# Patient Record
Sex: Female | Born: 1990 | Race: Black or African American | Hispanic: No | Marital: Single | Smoking: Never smoker
Health system: Southern US, Community
[De-identification: ages and names within clinical notes are randomized; demographics above are authoritative.]

## PROBLEM LIST (undated history)

## (undated) DIAGNOSIS — I10 Essential (primary) hypertension: Secondary | ICD-10-CM

## (undated) DIAGNOSIS — R51 Headache: Secondary | ICD-10-CM

## (undated) DIAGNOSIS — E041 Nontoxic single thyroid nodule: Secondary | ICD-10-CM

## (undated) DIAGNOSIS — D649 Anemia, unspecified: Secondary | ICD-10-CM

## (undated) DIAGNOSIS — R519 Headache, unspecified: Secondary | ICD-10-CM

## (undated) HISTORY — PX: WISDOM TOOTH EXTRACTION: SHX21

---

## 2016-10-21 ENCOUNTER — Emergency Department (HOSPITAL_COMMUNITY)
Admission: EM | Admit: 2016-10-21 | Discharge: 2016-10-21 | Disposition: A | Discharge status: Home / Self Care | Payer: BLUE CROSS/BLUE SHIELD | Attending: Emergency Medicine | Admitting: Emergency Medicine

## 2016-10-21 ENCOUNTER — Emergency Department (HOSPITAL_COMMUNITY): Payer: BLUE CROSS/BLUE SHIELD

## 2016-10-21 ENCOUNTER — Encounter (HOSPITAL_COMMUNITY): Payer: Self-pay | Admitting: Emergency Medicine

## 2016-10-21 DIAGNOSIS — R0789 Other chest pain: Secondary | ICD-10-CM | POA: Insufficient documentation

## 2016-10-21 DIAGNOSIS — R221 Localized swelling, mass and lump, neck: Secondary | ICD-10-CM | POA: Diagnosis not present

## 2016-10-21 DIAGNOSIS — R079 Chest pain, unspecified: Secondary | ICD-10-CM | POA: Diagnosis present

## 2016-10-21 DIAGNOSIS — I10 Essential (primary) hypertension: Secondary | ICD-10-CM | POA: Insufficient documentation

## 2016-10-21 HISTORY — DX: Essential (primary) hypertension: I10

## 2016-10-21 LAB — BASIC METABOLIC PANEL
ANION GAP: 5 (ref 5–15)
BUN: 14 mg/dL (ref 6–20)
CO2: 27 mmol/L (ref 22–32)
Calcium: 9.4 mg/dL (ref 8.9–10.3)
Chloride: 106 mmol/L (ref 101–111)
Creatinine, Ser: 0.84 mg/dL (ref 0.44–1.00)
GFR calc Af Amer: >60 mL/min (ref >60)
GFR calc non Af Amer: >60 mL/min (ref >60)
GLUCOSE: 141 mg/dL — AB (ref 65–99)
POTASSIUM: 3.5 mmol/L (ref 3.5–5.1)
Sodium: 138 mmol/L (ref 135–145)

## 2016-10-21 LAB — I-STAT TROPONIN, ED: Troponin i, poc: 0 ng/mL (ref 0.00–0.08)

## 2016-10-21 LAB — CBC
HEMATOCRIT: 38.2 % (ref 36.0–46.0)
Hemoglobin: 12.7 g/dL (ref 12.0–15.0)
MCH: 28 pg (ref 26.0–34.0)
MCHC: 33.2 g/dL (ref 30.0–36.0)
MCV: 84.1 fL (ref 78.0–100.0)
Platelets: 390 10*3/uL (ref 150–400)
RBC: 4.54 MIL/uL (ref 3.87–5.11)
RDW: 14.4 % (ref 11.5–15.5)
WBC: 5.1 10*3/uL (ref 4.0–10.5)

## 2016-10-21 LAB — TSH: TSH: 0.764 u[IU]/mL (ref 0.350–4.500)

## 2016-10-21 NOTE — Discharge Instructions (Signed)
Read the information below.  You may return to the Emergency Department at any time for worsening condition or any new symptoms that concern you.  If you develop worsening chest pain, shortness of breath, fever, you pass out, or become weak or dizzy, return to the ER for a recheck.     Please call your primary care provider to get worked in as soon as possible for further evaluation of the mass/knot on the front of your neck.  If you develop high fevers, difficulty swallowing or breathing, or you are unable to tolerate fluids by mouth, return to the ER immediately for a recheck.

## 2016-10-21 NOTE — ED Triage Notes (Signed)
Pt c/o intermittent sharp chest pain lasting a couple seconds onset a few days ago, lasting longer up to 1.5 minutes lately. No SOB, dizziness, palpitations.   Concerned about mass in throat x several weeks, unsure if related. Soft mass visualized at site of right thyroid lobe, moves with swallowing along with normal-sized left thyroid lobe. Hypertension since age 26.

## 2016-10-21 NOTE — ED Provider Notes (Signed)
[facility] DEPT Provider Note   CSN: [id number] Arrival date & time: 10/21/16  1148     History   Chief Complaint Chief Complaint  Patient presents with  . Chest Pain    HPI Lori Costa is a 26 y.o. female.  HPI   Pt with hx HTN p/w intermittent chest pain that has been happening at least twice per week for the past 10 years.  She usually has quick momentary chest pain in the left chest, sometimes bilaterally.  This morning the pain lasted 1.5 minutes, which alarmed her.  Denies any associated symptoms.    She also has noticed a soft, nontender nodule in the anterior throat.  She noticed it a few weeks ago.  Denies any difficulty swallowing or breathing.  She made an appointment with a new primary care provider to have it checked next month.    Denies fevers, URI symptoms, SOB, cough, weight loss, night sweats, reflux symptoms, abdominal pain, leg swelling, recent immobilization, exogenous estrogen.  Has never had her thyroid checked.    Past Medical History:  Diagnosis Date  . Hypertension    hereditary    There are no active problems to display for this patient.   History reviewed. No pertinent surgical history.  OB History    No data available       Home Medications    Prior to Admission medications   Not on File    Family History No family history on file.  Social History Social History  Substance Use Topics  . Smoking status: Not on file  . Smokeless tobacco: Not on file  . Alcohol use Not on file     Allergies   Patient has no known allergies.   Review of Systems Review of Systems  All other systems reviewed and are negative.    Physical Exam Updated Vital Signs BP 128/74   Pulse 76   Temp 98 F (36.7 C) (Oral)   Resp 14   Ht 5\' 5"  (1.651 m)   Wt 59 kg   LMP 09/12/2016 (Within Days)   SpO2 100%   BMI 21.63 kg/m   Physical Exam  Constitutional: She appears well-developed and well-nourished. No distress.  HENT:  Head:  Normocephalic and atraumatic.  Neck: Normal range of motion. Neck supple. No tracheal deviation present.  Soft, nontender rounded nodule/mass over right thyroid.  No overlying skin change.    Cardiovascular: Normal rate and regular rhythm.   Pulmonary/Chest: Effort normal and breath sounds normal. No stridor. No respiratory distress. She has no wheezes. She has no rales.  Abdominal: Soft. She exhibits no distension. There is no tenderness. There is no rebound and no guarding.  Lymphadenopathy:    She has no cervical adenopathy.  Neurological: She is alert.  Skin: She is not diaphoretic.  Nursing note and vitals reviewed.    ED Treatments / Results  Labs (all labs ordered are listed, but only abnormal results are displayed) Labs Reviewed  BASIC METABOLIC PANEL - Abnormal; Notable for the following:       Result Value   Glucose, Bld 141 (*)    All other components within normal limits  CBC  TSH  I-STAT TROPOININ, ED    EKG  EKG Interpretation None       Radiology Dg Chest 2 View  Result Date: 10/21/2016 CLINICAL DATA:  Chest pain EXAM: CHEST  2 VIEW COMPARISON:  None. FINDINGS: Lungs are clear.  No pleural effusion or pneumothorax. The heart is normal  in size. Visualized osseous structures are within normal limits. IMPRESSION: Normal chest radiographs. Electronically Signed   By: Julian Hy M.D.   On: 10/21/2016 12:53    Procedures Procedures (including critical care time)  Medications Ordered in ED Medications - No data to display   Initial Impression / Assessment and Plan / ED Course  I have reviewed the triage vital signs and the nursing notes.  Pertinent labs & imaging results that were available during my care of the patient were reviewed by me and considered in my medical decision making (see chart for details).    Afebrile, nontoxic patient with chronic intermittent chest pain.  Pain is atypical.  Doubt ACS.  No known risk factors for PE.  Pt advised to  attempt to get closer follow up with PCP to check anterior neck mass.  There are no airway concerns.  TSH is normal.  Doubt abscess.   D/C home with close PCP follow up  Discussed result, findings, treatment, and follow up  with patient.  Pt given return precautions.  Pt verbalizes understanding and agrees with plan.       Final Clinical Impressions(s) / ED Diagnoses   Final diagnoses:  Atypical chest pain  Neck mass    New Prescriptions There are no discharge medications for this patient.    Clayton Bibles, PA-C 10/21/16 [place], MD 10/21/16 [phone]

## 2016-11-01 ENCOUNTER — Other Ambulatory Visit: Payer: Self-pay | Admitting: Family Medicine

## 2016-11-01 DIAGNOSIS — R221 Localized swelling, mass and lump, neck: Secondary | ICD-10-CM

## 2016-11-05 ENCOUNTER — Ambulatory Visit
Admission: RE | Admit: 2016-11-05 | Discharge: 2016-11-05 | Disposition: A | Discharge status: Home / Self Care | Payer: BLUE CROSS/BLUE SHIELD | Source: Ambulatory Visit | Attending: Family Medicine | Admitting: Family Medicine

## 2016-11-05 DIAGNOSIS — R221 Localized swelling, mass and lump, neck: Secondary | ICD-10-CM

## 2016-11-26 ENCOUNTER — Other Ambulatory Visit: Payer: Self-pay | Admitting: Family Medicine

## 2016-11-26 DIAGNOSIS — I10 Essential (primary) hypertension: Secondary | ICD-10-CM

## 2016-12-05 ENCOUNTER — Ambulatory Visit
Admission: RE | Admit: 2016-12-05 | Discharge: 2016-12-05 | Disposition: A | Discharge status: Home / Self Care | Payer: BLUE CROSS/BLUE SHIELD | Source: Ambulatory Visit | Attending: Family Medicine | Admitting: Family Medicine

## 2016-12-05 DIAGNOSIS — I10 Essential (primary) hypertension: Secondary | ICD-10-CM

## 2016-12-17 ENCOUNTER — Telehealth: Payer: Self-pay | Admitting: Internal Medicine

## 2016-12-17 ENCOUNTER — Other Ambulatory Visit (HOSPITAL_COMMUNITY)
Admission: RE | Admit: 2016-12-17 | Discharge: 2016-12-17 | Disposition: A | Discharge status: Home / Self Care | Payer: BLUE CROSS/BLUE SHIELD | Source: Ambulatory Visit | Attending: Endocrinology | Admitting: Endocrinology

## 2016-12-17 ENCOUNTER — Ambulatory Visit (INDEPENDENT_AMBULATORY_CARE_PROVIDER_SITE_OTHER): Payer: BLUE CROSS/BLUE SHIELD | Admitting: Endocrinology

## 2016-12-17 ENCOUNTER — Encounter: Payer: Self-pay | Admitting: Endocrinology

## 2016-12-17 DIAGNOSIS — E041 Nontoxic single thyroid nodule: Secondary | ICD-10-CM | POA: Insufficient documentation

## 2016-12-17 NOTE — Progress Notes (Signed)
Subjective:    Patient ID: Lori Costa, female    DOB: [birth year], 26 y.o.   MRN: [id number]  HPI Pt is referred by Dr Marin Comment, for nodular thyroid.  1 month ago, pt was noted to have a nodule at the thyroid.  No assoc pain.  She has slight weight loss.  she is unaware of ever having had thyroid problems in the past.  she has no h/o XRT or surgery to the neck.   Past Medical History:  Diagnosis Date  . Hypertension    hereditary    No past surgical history on file.  Social History   Social History  . Marital status: Single    Spouse name: N/A  . Number of children: N/A  . Years of education: N/A   Occupational History  . Not on file.   Social History Main Topics  . Smoking status: Not on file  . Smokeless tobacco: Not on file  . Alcohol use Not on file  . Drug use: Unknown  . Sexual activity: Not on file   Other Topics Concern  . Not on file   Social History Narrative  . No narrative on file    No current outpatient prescriptions on file prior to visit.   No current facility-administered medications on file prior to visit.     No Known Allergies  Family History  Problem Relation Age of Onset  . Thyroid disease Neg Hx     BP (!) 160/80 (BP Location: Left Arm, Patient Position: Sitting, Cuff Size: Normal)   Pulse 96   Ht 5\' 5"  (1.651 m)   Wt 125 lb 6.4 oz (56.9 kg)   SpO2 97%   BMI 20.87 kg/m    Review of Systems Denies fatigue, hoarseness, visual loss, chest pain, sob, cough, dysphagia, diarrhea, itching, flushing, easy bruising, depression, cold intolerance, headache, numbness, and rhinorrhea.      Objective:   Physical Exam VS: see vs page GEN: no distress HEAD: head: no deformity eyes: no periorbital swelling, no proptosis external nose and ears are normal mouth: no lesion seen NECK: the right mid thyroid nodule is easily palpable.   CHEST WALL: no deformity LUNGS: clear to auscultation CV: reg rate and rhythm, no murmur ABD: abdomen is  soft, nontender.  no hepatosplenomegaly.  not distended.  no hernia MUSCULOSKELETAL: muscle bulk and strength are grossly normal.  no obvious joint swelling.  gait is normal and steady EXTEMITIES: no deformity.  no edema. PULSES: no carotid bruit. NEURO:  cn 2-12 grossly intact.   readily moves all 4's.  sensation is intact to touch on all 4's.  SKIN:  Normal texture and temperature.  No rash or suspicious lesion is visible.   NODES:  None palpable at the neck.  PSYCH: alert, well-oriented.  Does not appear anxious nor depressed.   [place]: The right mid isthmus nodule meets criteria for annual follow-up. It measures 1.9 cm and likely corresponds to the palpable abnormality.  thyroid needle bx: consent obtained, signed form on chart The area is first sprayed with cooling agent local: xylocaine 2%, with epinephrine prep: alcohol pad 4 bxs are done with 25 and 85O needles no complications  Lab Results  Component Value Date   TSH 0.764 10/21/2016   I have reviewed outside records, and summarized: Pt was noted to have thyroid nodule, and referred here.  She was seen in ER for chest pain, and reported noticing a nodule at the anterior neck.  Assessment & Plan:  Thyroid nodule, new, uncertain etiology.    HTN: on rx.     Patient Instructions  We'll let you know about the biopsy results. If as expected, no cancer is found, please come back for a follow-up appointment in 6 months. most of the time, a "lumpy thyroid" will eventually become overactive.  this is usually a slow process, happening over the span of many years. Please continue to see Dr Marin Comment for your blood pressure.

## 2016-12-17 NOTE — Patient Instructions (Addendum)
We'll let you know about the biopsy results.   If as expected, no cancer is found, please come back for a follow-up appointment in 6 months.   most of the time, a "lumpy thyroid" will eventually become overactive.  this is usually a slow process, happening over the span of many years.   Please continue to see Dr Marin Comment for your blood pressure.

## 2016-12-20 ENCOUNTER — Other Ambulatory Visit: Payer: Self-pay | Admitting: Endocrinology

## 2016-12-20 DIAGNOSIS — E041 Nontoxic single thyroid nodule: Secondary | ICD-10-CM

## 2017-01-15 ENCOUNTER — Other Ambulatory Visit: Payer: Self-pay | Admitting: Surgery

## 2017-01-17 ENCOUNTER — Emergency Department (HOSPITAL_COMMUNITY): Payer: Managed Care, Other (non HMO)

## 2017-01-17 ENCOUNTER — Encounter (HOSPITAL_COMMUNITY): Payer: Self-pay

## 2017-01-17 ENCOUNTER — Emergency Department (HOSPITAL_COMMUNITY)
Admission: EM | Admit: 2017-01-17 | Discharge: 2017-01-18 | Disposition: A | Discharge status: Home / Self Care | Payer: Managed Care, Other (non HMO) | Attending: Emergency Medicine | Admitting: Emergency Medicine

## 2017-01-17 DIAGNOSIS — Y92410 Unspecified street and highway as the place of occurrence of the external cause: Secondary | ICD-10-CM | POA: Insufficient documentation

## 2017-01-17 DIAGNOSIS — Y999 Unspecified external cause status: Secondary | ICD-10-CM | POA: Insufficient documentation

## 2017-01-17 DIAGNOSIS — M549 Dorsalgia, unspecified: Secondary | ICD-10-CM

## 2017-01-17 DIAGNOSIS — S1980XA Other specified injuries of unspecified part of neck, initial encounter: Secondary | ICD-10-CM | POA: Diagnosis present

## 2017-01-17 DIAGNOSIS — S161XXA Strain of muscle, fascia and tendon at neck level, initial encounter: Secondary | ICD-10-CM

## 2017-01-17 DIAGNOSIS — Y939 Activity, unspecified: Secondary | ICD-10-CM | POA: Insufficient documentation

## 2017-01-17 MED ORDER — IBUPROFEN 200 MG PO TABS
600.0000 mg | ORAL_TABLET | Freq: Once | ORAL | Status: AC
  Administered 2017-01-17: 600 mg via ORAL
  Filled 2017-01-17: qty 3

## 2017-01-17 MED ORDER — DIAZEPAM 2 MG PO TABS
2.0000 mg | ORAL_TABLET | Freq: Once | ORAL | Status: AC
  Administered 2017-01-17: 2 mg via ORAL
  Filled 2017-01-17: qty 1
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
## 2017-01-18 MED ORDER — CYCLOBENZAPRINE HCL 10 MG PO TABS: 10.0000 mg | ORAL_TABLET | Freq: Two times a day (BID) | ORAL | 0 refills | Status: DC | PRN

## 2017-01-18 MED ORDER — NAPROXEN 375 MG PO TABS: 375.0000 mg | ORAL_TABLET | Freq: Two times a day (BID) | ORAL | 0 refills | Status: DC
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
## 2017-04-13 ENCOUNTER — Emergency Department (HOSPITAL_COMMUNITY): Payer: Managed Care, Other (non HMO)

## 2017-04-13 ENCOUNTER — Emergency Department (HOSPITAL_COMMUNITY)
Admission: EM | Admit: 2017-04-13 | Discharge: 2017-04-14 | Disposition: A | Discharge status: Home / Self Care | Payer: Managed Care, Other (non HMO) | Attending: Emergency Medicine | Admitting: Emergency Medicine

## 2017-04-13 ENCOUNTER — Encounter (HOSPITAL_COMMUNITY): Payer: Self-pay | Admitting: Emergency Medicine

## 2017-04-13 DIAGNOSIS — R0602 Shortness of breath: Secondary | ICD-10-CM | POA: Diagnosis present

## 2017-04-13 DIAGNOSIS — R05 Cough: Secondary | ICD-10-CM | POA: Diagnosis not present

## 2017-04-13 DIAGNOSIS — Z79899 Other long term (current) drug therapy: Secondary | ICD-10-CM | POA: Diagnosis not present

## 2017-04-13 DIAGNOSIS — R110 Nausea: Secondary | ICD-10-CM | POA: Insufficient documentation

## 2017-04-13 DIAGNOSIS — I10 Essential (primary) hypertension: Secondary | ICD-10-CM | POA: Insufficient documentation

## 2017-04-13 DIAGNOSIS — R0981 Nasal congestion: Secondary | ICD-10-CM | POA: Insufficient documentation

## 2017-04-13 DIAGNOSIS — R509 Fever, unspecified: Secondary | ICD-10-CM | POA: Diagnosis not present

## 2017-04-13 DIAGNOSIS — B349 Viral infection, unspecified: Secondary | ICD-10-CM | POA: Diagnosis not present

## 2017-04-13 DIAGNOSIS — R079 Chest pain, unspecified: Secondary | ICD-10-CM | POA: Insufficient documentation

## 2017-04-13 LAB — BASIC METABOLIC PANEL
ANION GAP: 10 (ref 5–15)
BUN: 8 mg/dL (ref 6–20)
CHLORIDE: 106 mmol/L (ref 101–111)
CO2: 23 mmol/L (ref 22–32)
Calcium: 9.5 mg/dL (ref 8.9–10.3)
Creatinine, Ser: 0.79 mg/dL (ref 0.44–1.00)
GFR calc Af Amer: >60 mL/min (ref >60)
GLUCOSE: 103 mg/dL — AB (ref 65–99)
POTASSIUM: 3.6 mmol/L (ref 3.5–5.1)
Sodium: 139 mmol/L (ref 135–145)

## 2017-04-13 LAB — CBC
HEMATOCRIT: 36.7 % (ref 36.0–46.0)
HEMOGLOBIN: 11.8 g/dL — AB (ref 12.0–15.0)
MCH: 25.5 pg — AB (ref 26.0–34.0)
MCHC: 32.2 g/dL (ref 30.0–36.0)
MCV: 79.4 fL (ref 78.0–100.0)
Platelets: 412 10*3/uL — ABNORMAL HIGH (ref 150–400)
RBC: 4.62 MIL/uL (ref 3.87–5.11)
RDW: 15.2 % (ref 11.5–15.5)
WBC: 11.5 10*3/uL — ABNORMAL HIGH (ref 4.0–10.5)

## 2017-04-13 LAB — URINALYSIS, ROUTINE W REFLEX MICROSCOPIC
Bilirubin Urine: NEGATIVE
Glucose, UA: NEGATIVE mg/dL
Ketones, ur: NEGATIVE mg/dL
Nitrite: NEGATIVE
PH: 6 (ref 5.0–8.0)
Protein, ur: 30 mg/dL — AB
SPECIFIC GRAVITY, URINE: 1.021 (ref 1.005–1.030)

## 2017-04-13 LAB — POCT I-STAT TROPONIN I: Troponin i, poc: 0 ng/mL (ref 0.00–0.08)

## 2017-04-13 LAB — LIPASE, BLOOD: LIPASE: 21 U/L (ref 11–51)

## 2017-04-13 LAB — RAPID STREP SCREEN (MED CTR MEBANE ONLY): Streptococcus, Group A Screen (Direct): NEGATIVE

## 2017-04-13 LAB — I-STAT BETA HCG BLOOD, ED (NOT ORDERABLE)

## 2017-04-13 MED ORDER — ALBUTEROL SULFATE (2.5 MG/3ML) 0.083% IN NEBU
5.0000 mg | INHALATION_SOLUTION | Freq: Once | RESPIRATORY_TRACT | Status: AC
  Administered 2017-04-13: 5 mg via RESPIRATORY_TRACT
  Filled 2017-04-13: qty 6

## 2017-04-13 MED ORDER — ONDANSETRON 4 MG PO TBDP
4.0000 mg | ORAL_TABLET | Freq: Once | ORAL | Status: AC | PRN
Start: 2017-04-13 — End: 2017-04-13
  Administered 2017-04-13: 4 mg via ORAL
  Filled 2017-04-13: qty 1

## 2017-04-13 MED ORDER — ACETAMINOPHEN 325 MG PO TABS
650.0000 mg | ORAL_TABLET | Freq: Once | ORAL | Status: AC | PRN
  Administered 2017-04-13: 650 mg via ORAL
  Filled 2017-04-13: qty 2

## 2017-04-13 NOTE — ED Triage Notes (Signed)
Patient complaining of being hot and cold. Patient has a fever. Patient is also complaining of central chest pain and sob. Patient states these symptoms started today when she woke up.

## 2017-04-13 NOTE — ED Notes (Signed)
Bed: WTR7 Expected date:  Expected time:  Means of arrival:  Comments: 

## 2017-04-14 MED ORDER — ALBUTEROL SULFATE HFA 108 (90 BASE) MCG/ACT IN AERS: 1.0000 | INHALATION_SPRAY | Freq: Four times a day (QID) | RESPIRATORY_TRACT | 0 refills | Status: DC | PRN

## 2017-04-14 NOTE — Discharge Instructions (Signed)
It was my pleasure taking care of you today!  Albuterol inhaler every 4-6 hours for cough / shortness of breath. Alternate between Tylenol and ibuprofen as needed for body aches / fevers.  Rest, drink plenty of fluids to stay hydrated. Wash your hands often.  Follow up with your doctor in regards to your hospital visit. Return to the emergency department if symptoms worsen, become progressive, or become more concerning.  GET HELP RIGHT AWAY IF:  You have shortness of breath while resting.  You have pain or pressure in the chest You suddenly feel dizzy.  You feel confused.  You have a hard time breathing.  Your skin or nails turn bluish in color.  You get a bad neck pain or stiffness.  You get a bad headache, face pain, or earache.  You throw up (vomit) a lot and often.  You have a fever > 101 that persists   MAKE SURE YOU:  Understand these instructions.  Will watch your condition.  Will get help right away if you are not doing well or get worse.
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
## 2017-04-16 LAB — CULTURE, GROUP A STREP (THRC)

## 2017-06-17 DIAGNOSIS — Z118 Encounter for screening for other infectious and parasitic diseases: Secondary | ICD-10-CM | POA: Diagnosis not present

## 2017-06-17 DIAGNOSIS — R9389 Abnormal findings on diagnostic imaging of other specified body structures: Secondary | ICD-10-CM | POA: Diagnosis not present

## 2017-06-17 DIAGNOSIS — D259 Leiomyoma of uterus, unspecified: Secondary | ICD-10-CM | POA: Diagnosis not present

## 2017-06-17 DIAGNOSIS — R1900 Intra-abdominal and pelvic swelling, mass and lump, unspecified site: Secondary | ICD-10-CM | POA: Diagnosis not present

## 2017-06-17 DIAGNOSIS — B373 Candidiasis of vulva and vagina: Secondary | ICD-10-CM | POA: Diagnosis not present

## 2017-06-17 DIAGNOSIS — Z3009 Encounter for other general counseling and advice on contraception: Secondary | ICD-10-CM | POA: Diagnosis not present

## 2017-06-17 DIAGNOSIS — Z113 Encounter for screening for infections with a predominantly sexual mode of transmission: Secondary | ICD-10-CM | POA: Diagnosis not present

## 2017-06-18 ENCOUNTER — Ambulatory Visit: Payer: BLUE CROSS/BLUE SHIELD | Admitting: Endocrinology

## 2017-07-04 ENCOUNTER — Other Ambulatory Visit: Payer: Self-pay | Admitting: Surgery

## 2017-07-04 DIAGNOSIS — Z3042 Encounter for surveillance of injectable contraceptive: Secondary | ICD-10-CM | POA: Diagnosis not present

## 2017-07-05 NOTE — Pre-Procedure Instructions (Signed)
Lori Costa  07/05/2017      [id number] - [place], [place] - [address] [place] [address] Phone: [phone] Fax: [phone]    Your procedure is scheduled on July 11, 2017.  Report to [facility] Admitting at 1000 AM.  Call this number if you have problems the morning of surgery:  [phone]   Remember:  Do not eat food or drink liquids after midnight.  Take these medicines the morning of surgery with A SIP OF WATER albuterol inhaler, amlodipine (norvasc).  7 days prior to surgery STOP taking any Aspirin (unless otherwise instructed by your surgeon), Aleve, Naproxen, Ibuprofen, Motrin, Advil, Goody's, BC's, all herbal medications, fish oil, and all vitamins  Continue all other medications as instructed by your physician except follow the above medication instructions before surgery   Do not wear jewelry, make-up or nail polish.  Do not wear lotions, powders, or perfumes, or deodorant.  Do not shave 48 hours prior to surgery.    Do not bring valuables to the hospital.  [facility] is not responsible for any belongings or valuables.  Contacts, dentures or bridgework may not be worn into surgery.  Leave your suitcase in the car.  After surgery it may be brought to your room.  For patients admitted to the hospital, discharge time will be determined by your treatment team.  Patients discharged the day of surgery will not be allowed to drive home.   Special instructions:   [facility]- Preparing For Surgery  Before surgery, you can play an important role. Because skin is not sterile, your skin needs to be as free of germs as possible. You can reduce the number of germs on your skin by washing with CHG (chlorahexidine gluconate) Soap before surgery.  CHG is an antiseptic cleaner which kills germs and bonds with the skin to continue killing germs even after washing.  Please do not use if you have an allergy to CHG or  antibacterial soaps. If your skin becomes reddened/irritated stop using the CHG.  Do not shave (including legs and underarms) for at least 48 hours prior to first CHG shower. It is OK to shave your face.  Please follow these instructions carefully.   1. Shower the NIGHT BEFORE SURGERY and the MORNING OF SURGERY with CHG.   2. If you chose to wash your hair, wash your hair first as usual with your normal shampoo.  3. After you shampoo, rinse your hair and body thoroughly to remove the shampoo.  4. Use CHG as you would any other liquid soap. You can apply CHG directly to the skin and wash gently with a scrungie or a clean washcloth.   5. Apply the CHG Soap to your body ONLY FROM THE NECK DOWN.  Do not use on open wounds or open sores. Avoid contact with your eyes, ears, mouth and genitals (private parts). Wash Face and genitals (private parts)  with your normal soap.  6. Wash thoroughly, paying special attention to the area where your surgery will be performed.  7. Thoroughly rinse your body with warm water from the neck down.  8. DO NOT shower/wash with your normal soap after using and rinsing off the CHG Soap.  9. Pat yourself dry with a CLEAN TOWEL.  10. Wear CLEAN PAJAMAS to bed the night before surgery, wear comfortable clothes the morning of surgery  11. Place CLEAN SHEETS on your bed the night of your first shower and DO  NOT SLEEP WITH PETS.  Day of Surgery: Do not apply any deodorants/lotions. Please wear clean clothes to the hospital/surgery center.    Please read over the following fact sheets that you were given. Pain Booklet, Coughing and Deep Breathing and Surgical Site Infection Prevention

## 2017-07-08 ENCOUNTER — Inpatient Hospital Stay (HOSPITAL_COMMUNITY)
Admission: RE | Admit: 2017-07-08 | Discharge: 2017-07-08 | Disposition: A | Discharge status: Home / Self Care | Payer: Self-pay | Source: Ambulatory Visit

## 2017-07-08 NOTE — Pre-Procedure Instructions (Signed)
Lori Costa  07/08/2017      [id number] - [place], [place] - [place] [place] [address] Phone: [phone] Fax: [phone]    Your procedure is scheduled on Thursday,  July 11, 2017.   Report to [facility] Admitting at 1000 AM.             (posted surgery time 12:00 noon - 1:30 pm)   Call this number if you have problems the MORNING of surgery:  [phone]   Remember:   Do not eat food or drink liquids after midnight, Wednesday.   Take these medicines the morning of surgery with A SIP OF WATER albuterol inhaler, amlodipine (norvasc).  7 days prior to surgery STOP taking any Aspirin (unless otherwise instructed by your surgeon), Aleve, Naproxen, Ibuprofen, Motrin, Advil, Goody's, BC's, all herbal medications, fish oil, and all vitamins  Continue all other medications as instructed by your physician except follow the above medication instructions before surgery   Do not wear jewelry, make-up or nail polish.  Do not wear lotions, powders,  perfumes, or deodorant.  Do not shave 48 hours prior to surgery.     Do not bring valuables to the hospital.  [facility] is not responsible for any belongings or valuables.  Contacts, dentures or bridgework may not be worn into surgery.  Leave your suitcase in the car.  After surgery it may be brought to your room. For patients admitted to the hospital, discharge time will be determined by your treatment team       Special instructions:   - Preparing For Surgery  Before surgery, you can play an important role. Because skin is not sterile, your skin needs to be as free of germs as possible. You can reduce the number of germs on your skin by washing with CHG (chlorahexidine gluconate) Soap before surgery.  CHG is an antiseptic cleaner which kills germs and bonds with the skin to continue killing germs even after washing.  Please do not use if you have an allergy to CHG  or antibacterial soaps. If your skin becomes reddened/irritated stop using the CHG.  Do not shave (including legs and underarms) for at least 48 hours prior to first CHG shower. It is OK to shave your face.  Please follow these instructions carefully.   1. Shower the NIGHT BEFORE SURGERY and the MORNING OF SURGERY with CHG.   2. If you chose to wash your hair, wash your hair first as usual with your normal shampoo.  3. After you shampoo, rinse your hair and body thoroughly to remove the shampoo.  4. Use CHG as you would any other liquid soap. You can apply CHG directly to the skin and wash gently with a scrungie or a clean washcloth.   5. Apply the CHG Soap to your body ONLY FROM THE NECK DOWN.  Do not use on open wounds or open sores. Avoid contact with your eyes, ears, mouth and genitals (private parts). Wash Face and genitals (private parts)  with your normal soap.  6. Wash thoroughly, paying special attention to the area where your surgery will be performed.  7. Thoroughly rinse your body with warm water from the neck down.  8. DO NOT shower/wash with your normal soap after using and rinsing off the CHG Soap.  9. Pat yourself dry with a CLEAN TOWEL.  10. Wear CLEAN PAJAMAS to bed the night before surgery, wear comfortable clothes the morning of  surgery  11. Place CLEAN SHEETS on your bed the night of your first shower and DO NOT SLEEP WITH PETS.  Day of Surgery: Do not apply any deodorants/lotions. Please wear clean clothes to the hospital/surgery center.    Please read over the following fact sheets that you were given. Pain Booklet, Coughing and Deep Breathing and Surgical Site Infection Prevention

## 2017-07-08 NOTE — Progress Notes (Addendum)
NS for PAT appointment.  Unable to reach by phone by 2 different people at 2 different times.  Chart placed on bookshelf in PAT and name added to [facility] list.

## 2017-07-10 ENCOUNTER — Other Ambulatory Visit: Payer: Self-pay

## 2017-07-10 ENCOUNTER — Encounter (HOSPITAL_COMMUNITY): Payer: Self-pay | Admitting: *Deleted

## 2017-07-10 MED ORDER — CEFAZOLIN SODIUM-DEXTROSE 2-4 GM/100ML-% IV SOLN
2.0000 g | INTRAVENOUS | Status: AC
  Administered 2017-07-11: 2 g via INTRAVENOUS
  Filled 2017-07-10: qty 100

## 2017-07-10 NOTE — H&P (Signed)
Lori Costa  Location: [facility] Patient #: [id number] DOB: 1990-12-12 Single / Language: Undefined / Race: Black or African American Female   History of Present Illness  Patient words: New-Thyroid.  The patient is a 26 year old female who presents with a thyroid nodule. This is a pleasant 26 year old female referred by Dr. Renato Shin for evaluation of a thyroid nodule. She actually noticed the nodule herself about a year ago. She reports it is been slowly growing. She has had no symptoms from this. She denies any difficulty swallowing or hoarseness. Her father reports that there is a history of thyroid issues in the family but not cancer. She is otherwise healthy without complaints. She has had an ultrasound showing the nodule on the right isthmus of the thyroid gland which is approximately 1.9 cm. Dr. Haywood Lasso performed a fine-needle aspiration showing atypical cells.   Past Surgical History  No pertinent past surgical history   Diagnostic Studies History Colonoscopy  never Mammogram  never Pap Smear  1-5 years ago  Allergies \ No Known Drug Allergies \  Medication History  AmLODIPine Besylate (2.5MG  Tablet, Oral) Active. Medications Reconciled  Social History Alcohol use  Occasional alcohol use. No caffeine use  Tobacco use  Current some day smoker.  Family History Hypertension  Father.  Pregnancy / Birth History   Age at menarche  56 years. Gravida  0 Para  0 Regular periods   Other Problems  High blood pressure     Review of Systems  General Not Present- Appetite Loss, Chills, Fatigue, Fever, Night Sweats, Weight Gain and Weight Loss. Skin Not Present- Change in Wart/Mole, Dryness, Hives, Jaundice, New Lesions, Non-Healing Wounds, Rash and Ulcer. HEENT Not Present- Earache, Hearing Loss, Hoarseness, Nose Bleed, Oral Ulcers, Ringing in the Ears, Seasonal Allergies, Sinus Pain, Sore Throat, Visual Disturbances, Wears  glasses/contact lenses and Yellow Eyes. Respiratory Not Present- Bloody sputum, Chronic Cough, Difficulty Breathing, Snoring and Wheezing. Breast Not Present- Breast Mass, Breast Pain, Nipple Discharge and Skin Changes. Cardiovascular Not Present- Chest Pain, Difficulty Breathing Lying Down, Leg Cramps, Palpitations, Rapid Heart Rate, Shortness of Breath and Swelling of Extremities. Gastrointestinal Not Present- Abdominal Pain, Bloating, Bloody Stool, Change in Bowel Habits, Chronic diarrhea, Constipation, Difficulty Swallowing, Excessive gas, Gets full quickly at meals, Hemorrhoids, Indigestion, Nausea, Rectal Pain and Vomiting. Female Genitourinary Not Present- Frequency, Nocturia, Painful Urination, Pelvic Pain and Urgency. Musculoskeletal Not Present- Back Pain, Joint Pain, Joint Stiffness, Muscle Pain, Muscle Weakness and Swelling of Extremities. Neurological Not Present- Decreased Memory, Fainting, Headaches, Numbness, Seizures, Tingling, Tremor, Trouble walking and Weakness. Psychiatric Not Present- Anxiety, Bipolar, Change in Sleep Pattern, Depression, Fearful and Frequent crying. Endocrine Not Present- Cold Intolerance, Excessive Hunger, Hair Changes, Heat Intolerance, Hot flashes and New Diabetes. Hematology Not Present- Blood Thinners, Easy Bruising, Excessive bleeding, Gland problems, HIV and Persistent Infections.  Vitals  Weight: 131 lb Height: 65in Body Surface Area: 1.65 m Body Mass Index: 21.8 kg/m  Temp.: 99.106F(Oral)  Pulse: 85 (Regular)  BP: 144/90 (Sitting, Left Arm, Standard)   Physical Exam  General Mental Status-Alert. General Appearance-Consistent with stated age. Hydration-Well hydrated. Voice-Normal.  Head and Neck Head-normocephalic, atraumatic with no lesions or palpable masses. Trachea-midline. Thyroid Gland Characteristics - normal size and consistency. There is a palpable mass/nodule found and described as follows: - The  location of the mass/nodule is the - Note: There is an easily mobile nodule just to the lower right midline of the neck. There is no adenopathy.  Eye Eyeball -  Bilateral-Extraocular movements intact. Sclera/Conjunctiva - Bilateral-No scleral icterus.  Chest and Lung Exam Chest and lung exam reveals -quiet, even and easy respiratory effort with no use of accessory muscles and on auscultation, normal breath sounds, no adventitious sounds and normal vocal resonance. Inspection Chest Wall - Normal. Back - normal.  Breast - Did not examine.  Cardiovascular Cardiovascular examination reveals -normal heart sounds, regular rate and rhythm with no murmurs and normal pedal pulses bilaterally.  Abdomen - Did not examine.  Neurologic - Did not examine.  Musculoskeletal Normal Exam - Left-Upper Extremity Strength Normal and Lower Extremity Strength Normal. Normal Exam - Right-Upper Extremity Strength Normal and Lower Extremity Strength Normal.  Lymphatic Head & Neck  General Head & Neck Lymphatics: Bilateral - Description - Normal. Axillary  General Axillary Region: Bilateral - Description - Normal. Tenderness - Non Tender. Femoral & Inguinal - Did not examine.    Assessment & Plan   THYROID NODULE (E04.1)  Impression: This is a patient with a thyroid nodule showing atypical cells on fine-needle aspiration. Excision of the right-sided nodule and possible right thyroid lobectomy is recommended for histologic evaluation to rule out malignancy. I discussed this with the patient and her family in detail. I gave him a copy of the pathology report. I discussed the surgical procedure and risks. These risks include but are not limited to bleeding, infection, injury to surrounding structures including the vocal cords, permanent hoarseness, need for further surgery if malignancy is found, postoperative recovery, etc. They understand and wish to proceed with surgery which will be  scheduled

## 2017-07-10 NOTE — Progress Notes (Signed)
Pt denies SOB, chest pain, and being under the care of a cardiologist. Pt denies having a stress test, echo and cardiac cath. Pt denies having recent labs. Pt made aware to stop taking Aspirin, vitamins, fish oil and herbal medications. Do not take any NSAIDs ie: Ibuprofen, Advil, Naproxen (Aleve/Naprosyn), Motrin, BC and Goody Powder or any medication containing Aspirin. Pt verbalized understanding of all pre-op instructions.

## 2017-07-11 ENCOUNTER — Ambulatory Visit (HOSPITAL_COMMUNITY): Payer: Federal, State, Local not specified - PPO

## 2017-07-11 ENCOUNTER — Ambulatory Visit (HOSPITAL_COMMUNITY): Payer: Federal, State, Local not specified - PPO | Admitting: Vascular Surgery

## 2017-07-11 ENCOUNTER — Encounter (HOSPITAL_COMMUNITY)
Admission: RE | Disposition: A | Discharge status: Home / Self Care | Payer: Self-pay | Source: Ambulatory Visit | Attending: Surgery

## 2017-07-11 ENCOUNTER — Encounter (HOSPITAL_COMMUNITY): Payer: Self-pay | Admitting: Anesthesiology

## 2017-07-11 ENCOUNTER — Ambulatory Visit (HOSPITAL_COMMUNITY)
Admission: RE | Admit: 2017-07-11 | Discharge: 2017-07-11 | Disposition: A | Discharge status: Home / Self Care | Payer: Federal, State, Local not specified - PPO | Source: Ambulatory Visit | Attending: Surgery | Admitting: Surgery

## 2017-07-11 ENCOUNTER — Ambulatory Visit (HOSPITAL_COMMUNITY): Payer: Federal, State, Local not specified - PPO | Admitting: Anesthesiology

## 2017-07-11 DIAGNOSIS — F17210 Nicotine dependence, cigarettes, uncomplicated: Secondary | ICD-10-CM | POA: Insufficient documentation

## 2017-07-11 DIAGNOSIS — Z01818 Encounter for other preprocedural examination: Secondary | ICD-10-CM

## 2017-07-11 DIAGNOSIS — Z79899 Other long term (current) drug therapy: Secondary | ICD-10-CM | POA: Diagnosis not present

## 2017-07-11 DIAGNOSIS — D34 Benign neoplasm of thyroid gland: Secondary | ICD-10-CM | POA: Diagnosis not present

## 2017-07-11 DIAGNOSIS — E041 Nontoxic single thyroid nodule: Secondary | ICD-10-CM | POA: Insufficient documentation

## 2017-07-11 DIAGNOSIS — I10 Essential (primary) hypertension: Secondary | ICD-10-CM | POA: Insufficient documentation

## 2017-07-11 DIAGNOSIS — J45909 Unspecified asthma, uncomplicated: Secondary | ICD-10-CM | POA: Diagnosis not present

## 2017-07-11 HISTORY — PX: THYROID LOBECTOMY: SHX420

## 2017-07-11 HISTORY — DX: Nontoxic single thyroid nodule: E04.1

## 2017-07-11 HISTORY — DX: Headache: R51

## 2017-07-11 HISTORY — DX: Anemia, unspecified: D64.9

## 2017-07-11 HISTORY — DX: Headache, unspecified: R51.9

## 2017-07-11 LAB — CBC
HEMATOCRIT: 30.4 % — AB (ref 36.0–46.0)
HEMOGLOBIN: 9.5 g/dL — AB (ref 12.0–15.0)
MCH: 24.9 pg — AB (ref 26.0–34.0)
MCHC: 31.3 g/dL (ref 30.0–36.0)
MCV: 79.6 fL (ref 78.0–100.0)
Platelets: 395 10*3/uL (ref 150–400)
RBC: 3.82 MIL/uL — AB (ref 3.87–5.11)
RDW: 16 % — ABNORMAL HIGH (ref 11.5–15.5)
WBC: 5.6 10*3/uL (ref 4.0–10.5)

## 2017-07-11 LAB — I-STAT BETA HCG BLOOD, ED (NOT ORDERABLE): I-stat hCG, quantitative: <5 m[IU]/mL (ref <5)

## 2017-07-11 SURGERY — LOBECTOMY, THYROID
Anesthesia: General | Site: Neck | Laterality: Right

## 2017-07-11 MED ORDER — ONDANSETRON HCL 4 MG/2ML IJ SOLN
INTRAMUSCULAR | Status: AC
  Filled 2017-07-11: qty 2

## 2017-07-11 MED ORDER — OXYCODONE HCL 5 MG/5ML PO SOLN
ORAL | Status: AC
  Filled 2017-07-11: qty 5

## 2017-07-11 MED ORDER — BUPIVACAINE-EPINEPHRINE (PF) 0.25% -1:200000 IJ SOLN
INTRAMUSCULAR | Status: DC | PRN
  Administered 2017-07-11: 10 mL

## 2017-07-11 MED ORDER — CHLORHEXIDINE GLUCONATE CLOTH 2 % EX PADS: 6.0000 | MEDICATED_PAD | Freq: Once | CUTANEOUS | Status: DC

## 2017-07-11 MED ORDER — SUCCINYLCHOLINE CHLORIDE 200 MG/10ML IV SOSY
PREFILLED_SYRINGE | INTRAVENOUS | Status: AC
  Filled 2017-07-11: qty 10

## 2017-07-11 MED ORDER — MIDAZOLAM HCL 2 MG/2ML IJ SOLN
INTRAMUSCULAR | Status: AC
  Filled 2017-07-11: qty 2

## 2017-07-11 MED ORDER — HYDROCODONE-ACETAMINOPHEN 7.5-325 MG PO TABS: 1.0000 | ORAL_TABLET | Freq: Once | ORAL | Status: DC | PRN

## 2017-07-11 MED ORDER — METOCLOPRAMIDE HCL 5 MG/ML IJ SOLN: 10.0000 mg | Freq: Once | INTRAMUSCULAR | Status: DC | PRN

## 2017-07-11 MED ORDER — LIDOCAINE 2% (20 MG/ML) 5 ML SYRINGE
INTRAMUSCULAR | Status: DC | PRN
  Administered 2017-07-11: 60 mg via INTRAVENOUS

## 2017-07-11 MED ORDER — HEMOSTATIC AGENTS (NO CHARGE) OPTIME
TOPICAL | Status: DC | PRN
  Administered 2017-07-11: 1 via TOPICAL

## 2017-07-11 MED ORDER — SUCCINYLCHOLINE CHLORIDE 20 MG/ML IJ SOLN
INTRAMUSCULAR | Status: DC | PRN
  Administered 2017-07-11: 100 mg via INTRAVENOUS

## 2017-07-11 MED ORDER — LACTATED RINGERS IV SOLN
INTRAVENOUS | Status: DC
  Administered 2017-07-11: 10:00:00 via INTRAVENOUS

## 2017-07-11 MED ORDER — SCOPOLAMINE 1 MG/3DAYS TD PT72
MEDICATED_PATCH | TRANSDERMAL | Status: AC
  Filled 2017-07-11: qty 1

## 2017-07-11 MED ORDER — 0.9 % SODIUM CHLORIDE (POUR BTL) OPTIME
TOPICAL | Status: DC | PRN
  Administered 2017-07-11: 1000 mL

## 2017-07-11 MED ORDER — MIDAZOLAM HCL 5 MG/5ML IJ SOLN
INTRAMUSCULAR | Status: DC | PRN
  Administered 2017-07-11: 2 mg via INTRAVENOUS

## 2017-07-11 MED ORDER — SUGAMMADEX SODIUM 200 MG/2ML IV SOLN
INTRAVENOUS | Status: DC | PRN
  Administered 2017-07-11: 200 mg via INTRAVENOUS

## 2017-07-11 MED ORDER — ROCURONIUM BROMIDE 10 MG/ML (PF) SYRINGE
PREFILLED_SYRINGE | INTRAVENOUS | Status: AC
  Filled 2017-07-11: qty 5

## 2017-07-11 MED ORDER — FENTANYL CITRATE (PF) 100 MCG/2ML IJ SOLN
INTRAMUSCULAR | Status: DC | PRN
  Administered 2017-07-11 (×3): 50 ug via INTRAVENOUS

## 2017-07-11 MED ORDER — BUPIVACAINE-EPINEPHRINE (PF) 0.25% -1:200000 IJ SOLN
INTRAMUSCULAR | Status: AC
  Filled 2017-07-11: qty 30

## 2017-07-11 MED ORDER — OXYCODONE HCL 5 MG PO TABS: 5.0000 mg | ORAL_TABLET | Freq: Four times a day (QID) | ORAL | 0 refills | Status: DC | PRN

## 2017-07-11 MED ORDER — HYDROMORPHONE HCL 1 MG/ML IJ SOLN
INTRAMUSCULAR | Status: AC
  Filled 2017-07-11: qty 1

## 2017-07-11 MED ORDER — NEOSTIGMINE METHYLSULFATE 5 MG/5ML IV SOSY
PREFILLED_SYRINGE | INTRAVENOUS | Status: AC
  Filled 2017-07-11: qty 5

## 2017-07-11 MED ORDER — DEXAMETHASONE SODIUM PHOSPHATE 4 MG/ML IJ SOLN
INTRAMUSCULAR | Status: DC | PRN
  Administered 2017-07-11: 10 mg via INTRAVENOUS

## 2017-07-11 MED ORDER — SCOPOLAMINE 1 MG/3DAYS TD PT72
1.0000 | MEDICATED_PATCH | TRANSDERMAL | Status: DC
  Administered 2017-07-11: 1.5 mg via TRANSDERMAL

## 2017-07-11 MED ORDER — ROCURONIUM BROMIDE 100 MG/10ML IV SOLN
INTRAVENOUS | Status: DC | PRN
  Administered 2017-07-11: 30 mg via INTRAVENOUS

## 2017-07-11 MED ORDER — FENTANYL CITRATE (PF) 250 MCG/5ML IJ SOLN
INTRAMUSCULAR | Status: AC
  Filled 2017-07-11: qty 5

## 2017-07-11 MED ORDER — PROPOFOL 10 MG/ML IV BOLUS
INTRAVENOUS | Status: DC | PRN
  Administered 2017-07-11: 170 mg via INTRAVENOUS

## 2017-07-11 MED ORDER — DEXAMETHASONE SODIUM PHOSPHATE 10 MG/ML IJ SOLN
INTRAMUSCULAR | Status: AC
  Filled 2017-07-11: qty 1

## 2017-07-11 MED ORDER — MEPERIDINE HCL 25 MG/ML IJ SOLN: 6.2500 mg | INTRAMUSCULAR | Status: DC | PRN

## 2017-07-11 MED ORDER — OXYCODONE HCL 5 MG PO TABS: 5.0000 mg | ORAL_TABLET | ORAL | Status: DC | PRN

## 2017-07-11 MED ORDER — HYDROMORPHONE HCL 1 MG/ML IJ SOLN
0.2500 mg | INTRAMUSCULAR | Status: DC | PRN
  Administered 2017-07-11: 0.25 mg via INTRAVENOUS

## 2017-07-11 MED ORDER — ONDANSETRON HCL 4 MG/2ML IJ SOLN
INTRAMUSCULAR | Status: DC | PRN
  Administered 2017-07-11: 4 mg via INTRAVENOUS

## 2017-07-11 MED ORDER — OXYCODONE HCL 5 MG/5ML PO SOLN
5.0000 mg | ORAL | Status: DC | PRN
  Administered 2017-07-11: 5 mg via ORAL

## 2017-07-11 SURGICAL SUPPLY — 35 items
CANISTER SUCT 3000ML PPV (MISCELLANEOUS) ×2 IMPLANT
CHLORAPREP W/TINT 10.5 ML (MISCELLANEOUS) ×2 IMPLANT
CLIP VESOCCLUDE MED 24/CT (CLIP) ×2 IMPLANT
CLIP VESOCCLUDE SM WIDE 24/CT (CLIP) ×2 IMPLANT
COVER SURGICAL LIGHT HANDLE (MISCELLANEOUS) ×2 IMPLANT
CRADLE DONUT ADULT HEAD (MISCELLANEOUS) ×2 IMPLANT
DERMABOND ADVANCED (GAUZE/BANDAGES/DRESSINGS) ×1
DERMABOND ADVANCED .7 DNX12 (GAUZE/BANDAGES/DRESSINGS) ×1 IMPLANT
DRAPE LAPAROTOMY 100X72 PEDS (DRAPES) ×2 IMPLANT
DRAPE UTILITY XL STRL (DRAPES) ×4 IMPLANT
ELECT CAUTERY BLADE 6.4 (BLADE) ×2 IMPLANT
ELECT REM PT RETURN 9FT ADLT (ELECTROSURGICAL) ×2
ELECTRODE REM PT RTRN 9FT ADLT (ELECTROSURGICAL) ×1 IMPLANT
GAUZE SPONGE 4X4 12PLY STRL (GAUZE/BANDAGES/DRESSINGS) ×2 IMPLANT
GAUZE SPONGE 4X4 16PLY XRAY LF (GAUZE/BANDAGES/DRESSINGS) ×2 IMPLANT
GLOVE SURG SIGNA 7.5 PF LTX (GLOVE) ×2 IMPLANT
GOWN STRL REUS W/ TWL LRG LVL3 (GOWN DISPOSABLE) ×2 IMPLANT
GOWN STRL REUS W/ TWL XL LVL3 (GOWN DISPOSABLE) ×1 IMPLANT
GOWN STRL REUS W/TWL LRG LVL3 (GOWN DISPOSABLE) ×2
GOWN STRL REUS W/TWL XL LVL3 (GOWN DISPOSABLE) ×1
HEMOSTAT SURGICEL 2X14 (HEMOSTASIS) ×2 IMPLANT
KIT BASIN OR (CUSTOM PROCEDURE TRAY) ×2 IMPLANT
KIT ROOM TURNOVER OR (KITS) ×2 IMPLANT
NEEDLE HYPO 25GX1X1/2 BEV (NEEDLE) ×2 IMPLANT
NS IRRIG 1000ML POUR BTL (IV SOLUTION) ×2 IMPLANT
PACK SURGICAL SETUP 50X90 (CUSTOM PROCEDURE TRAY) ×2 IMPLANT
PAD ARMBOARD 7.5X6 YLW CONV (MISCELLANEOUS) ×4 IMPLANT
PENCIL BUTTON HOLSTER BLD 10FT (ELECTRODE) ×2 IMPLANT
SHEARS HARMONIC 9CM CVD (BLADE) ×2 IMPLANT
SPONGE INTESTINAL PEANUT (DISPOSABLE) ×2 IMPLANT
SUT MNCRL AB 4-0 PS2 18 (SUTURE) ×2 IMPLANT
SUT VIC AB 3-0 SH 18 (SUTURE) ×2 IMPLANT
SYR BULB 3OZ (MISCELLANEOUS) ×2 IMPLANT
SYR CONTROL 10ML LL (SYRINGE) ×2 IMPLANT
TUBE CONNECTING 12X1/4 (SUCTIONS) ×2 IMPLANT

## 2017-07-11 NOTE — Op Note (Signed)
EXCISION RIGHT THYROID NODULE  Procedure Note  Lori Costa 07/11/2017   Pre-op Diagnosis: Right thyroid nodule     Post-op Diagnosis: Thyroid nodule of the Isthmus  Procedure(s): EXCISION CENTRAL THYROID NODULE  Surgeon(s): Coralie Keens, MD  Anesthesia: General  Staff:  Circulator: Rozell Searing, RN Scrub Person: Rolan Bucco  Estimated Blood Loss: Minimal               Specimens: sent to path  Indications: This is a 26 year old female who was found to have a thyroid nodule earlier this year.  A fine-needle aspiration showed atypical cells.  On ultrasound, the nodule was slightly less than 2 cm and appeared to be in the isthmus just to the right side.  Findings: The thyroid nodule was indeed centrally located in the isthmus.  The left and right thyroid lobes were normal with no palpable nodules.  I elected to excise just the central nodule  Procedure: The patient was brought to the operating room and identified as the correct patient.  She was placed supine on the operating room table and general anesthesia was induced.  Her neck was then prepped and draped in the usual sterile fashion.  She had a long and thin neck and the nodule was easily visible at the midline.  I made a incision on the neck overlying the nodule.  I took this down through the platysma with the cautery.  I created superior and inferior skin flaps.  I then opened up the midline with the cautery.  I dissected and retracted the muscles laterally.  This then easily exposed to the mobile nodule.  The nodule was in the center of the neck on the isthmus of the thyroid.  I palpated the right and left lobes of the thyroid gland and felt no nodules.  There was no cervical adenopathy.  At this point I elected just to excise the nodule.  I was able to easily separate the surrounding tissue off the top of the nodule with [place].  I then excised the nodule with both the cautery and the harmonic scalpel.  The  nodule was then sent to pathology for evaluation.  Hemostasis appeared to be achieved.  I placed a small piece of surgical Surgicel into the wound.  Prior to this, I irrigated the wound with saline.  I then reapproximated the midline muscles with interrupted 3-0 Vicryl sutures.  I then closed the platysma with interrupted 3-0 Vicryl sutures and closed the skin with a running 4-0 Monocryl.  Dermabond was then applied.  The patient tolerated the procedure well.  All the counts were correct at the end of the procedure.  The patient was then extubated in the operating room and taken in a stable condition to the recovery room.          [name]   Date: 07/11/2017  Time: 10:43 AM

## 2017-07-11 NOTE — Anesthesia Postprocedure Evaluation (Signed)
Anesthesia Post Note  Patient: Lori Costa  Procedure(s) Performed: EXCISION RIGHT THYROID NODULE (Right Neck)     Patient location during evaluation: PACU Anesthesia Type: General Level of consciousness: awake and alert and oriented Pain management: pain level controlled Vital Signs Assessment: post-procedure vital signs reviewed and stable Respiratory status: spontaneous breathing, nonlabored ventilation and respiratory function stable Cardiovascular status: blood pressure returned to baseline and stable Postop Assessment: no apparent nausea or vomiting Anesthetic complications: no    Last Vitals:  Vitals:   07/11/17 1129 07/11/17 1139  BP: (!) 143/87   Pulse: 71   Resp: 18 18  Temp: (!) 36.1 C   SpO2: 100% 99%    Last Pain:  Vitals:   07/11/17 1129  TempSrc:   PainSc: 3                  Teylor Wolven A.

## 2017-07-11 NOTE — Discharge Instructions (Signed)
You may shower starting tomorrow  No soaking in a tub or swimming for 1 week  No vigorous activity for 1 week  You may use ice pack, Tylenol, and ibuprofen also for pain  I will call you with the final pathology results

## 2017-07-11 NOTE — Progress Notes (Signed)
Dr Royce Macadamia informed of BP 150/95

## 2017-07-11 NOTE — Anesthesia Preprocedure Evaluation (Signed)
Anesthesia Evaluation  Patient identified by MRN, date of birth, ID band Patient awake    Reviewed: Allergy & Precautions, NPO status , Patient's Chart, lab work & pertinent test results  Airway Mallampati: II  TM Distance: >3 FB Neck ROM: Full    Dental no notable dental hx. (+) Teeth Intact   Pulmonary neg pulmonary ROS,    Pulmonary exam normal breath sounds clear to auscultation       Cardiovascular hypertension, Pt. on medications Normal cardiovascular exam Rhythm:Regular Rate:Normal     Neuro/Psych  Headaches, negative psych ROS   GI/Hepatic   Endo/Other  Right Thyroid nodule  Renal/GU   negative genitourinary   Musculoskeletal negative musculoskeletal ROS (+)   Abdominal   Peds  Hematology  (+) anemia ,   Anesthesia Other Findings   Reproductive/Obstetrics negative OB ROS                             Anesthesia Physical Anesthesia Plan  ASA: II  Anesthesia Plan: General   Post-op Pain Management:    Induction: Intravenous  PONV Risk Score and Plan: 4 or greater and Scopolamine patch - Pre-op, Midazolam, Dexamethasone, Ondansetron and Treatment may vary due to age or medical condition  Airway Management Planned: Oral ETT  Additional Equipment:   Intra-op Plan:   Post-operative Plan: Extubation in OR  Informed Consent: I have reviewed the patients History and Physical, chart, labs and discussed the procedure including the risks, benefits and alternatives for the proposed anesthesia with the patient or authorized representative who has indicated his/her understanding and acceptance.   Dental advisory given  Plan Discussed with: CRNA, Anesthesiologist and Surgeon  Anesthesia Plan Comments:         Anesthesia Quick Evaluation

## 2017-07-11 NOTE — Interval H&P Note (Signed)
History and Physical Interval Note: no change in H and P  07/11/2017 9:05 AM  Lori Costa  has presented today for surgery, with the diagnosis of Right thyroid nodule  The various methods of treatment have been discussed with the patient and family. After consideration of risks, benefits and other options for treatment, the patient has consented to  Procedure(s): EXCISION RIGHT THYROID NODULE, POSSIBLE RIGHT LOBECTOMY (Right) as a surgical intervention .  The patient's history has been reviewed, patient examined, no change in status, stable for surgery.  I have reviewed the patient's chart and labs.  Questions were answered to the patient's satisfaction.     Linsi Humann A

## 2017-07-11 NOTE — Transfer of Care (Signed)
Immediate Anesthesia Transfer of Care Note  Patient: Lori Costa  Procedure(s) Performed: EXCISION RIGHT THYROID NODULE (Right Neck)  Patient Location: PACU  Anesthesia Type:General  Level of Consciousness: awake and alert   Airway & Oxygen Therapy: Patient Spontanous Breathing and Patient connected to face mask oxygen  Post-op Assessment: Report given to RN and Post -op Vital signs reviewed and stable  Post vital signs: Reviewed and stable  Last Vitals:  Vitals:   07/11/17 0924  BP: (!) 150/95  Pulse: 83  Resp: 20  Temp: 37.2 C  SpO2: 100%    Last Pain:  Vitals:   07/11/17 0924  TempSrc: Oral      Patients Stated Pain Goal: 2 (32/99/24 2683)  Complications: No apparent anesthesia complications

## 2017-07-11 NOTE — Anesthesia Procedure Notes (Addendum)
Procedure Name: Intubation Date/Time: 07/11/2017 9:57 AM Performed by: Lieutenant Diego, CRNA Pre-anesthesia Checklist: Patient identified, Emergency Drugs available, Suction available and Patient being monitored Patient Re-evaluated:Patient Re-evaluated prior to induction Oxygen Delivery Method: Circle system utilized Preoxygenation: Pre-oxygenation with 100% oxygen Induction Type: IV induction Ventilation: Mask ventilation without difficulty Laryngoscope Size: Miller and 2 Grade View: Grade I Tube type: Oral Tube size: 7.0 mm Number of attempts: 1 Airway Equipment and Method: Stylet Placement Confirmation: ETT inserted through vocal cords under direct vision,  positive ETCO2 and breath sounds checked- equal and bilateral Secured at: 21 cm Tube secured with: Tape Dental Injury: Teeth and Oropharynx as per pre-operative assessment

## 2017-07-12 ENCOUNTER — Encounter (HOSPITAL_COMMUNITY): Payer: Self-pay | Admitting: Surgery

## 2017-07-12 NOTE — Progress Notes (Signed)
Dilaudid IV was wasted by Laneta Simmers, RN, Department Director and Ileene Rubens, RN. Given was 0.25mg  IV and wasted 0.75 mg Dilaudid IV. Dilaudid was given by Rex Kras, RN on 07/11/2017 at 1147. She left in locked drawer and did not get wasted until 07/12/2017.

## 2017-09-11 DIAGNOSIS — R9389 Abnormal findings on diagnostic imaging of other specified body structures: Secondary | ICD-10-CM | POA: Diagnosis not present

## 2017-09-11 DIAGNOSIS — N938 Other specified abnormal uterine and vaginal bleeding: Secondary | ICD-10-CM | POA: Diagnosis not present

## 2017-09-19 DIAGNOSIS — Z3042 Encounter for surveillance of injectable contraceptive: Secondary | ICD-10-CM | POA: Diagnosis not present

## 2017-10-16 IMAGING — US US SOFT TISSUE HEAD/NECK
1 series · 13 of 25 positions shown · non-contrast
Comparison: None.

CLINICAL DATA: Palpable abnormality.

EXAM:
THYROID ULTRASOUND
TECHNIQUE: Ultrasound examination of the thyroid gland and adjacent soft
tissues was performed.

[Series 1: us soft tissue head/neck · 0.07mm/px · 13 of 45 slices shown]
[im 1/45]
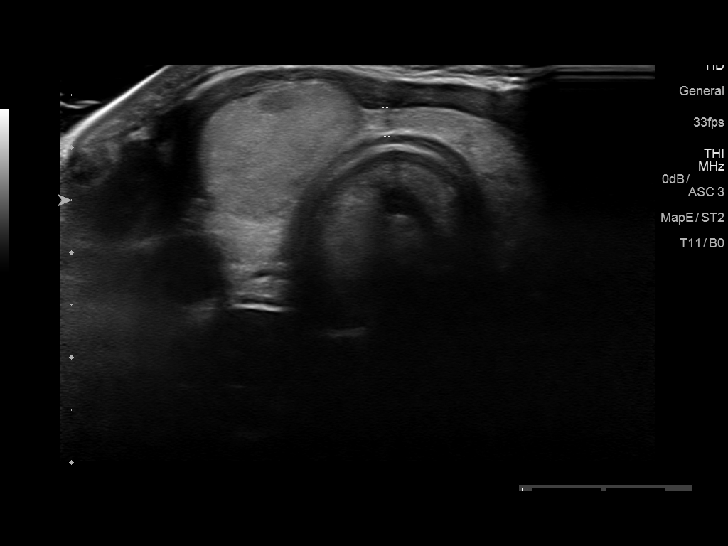
[im 4/45]
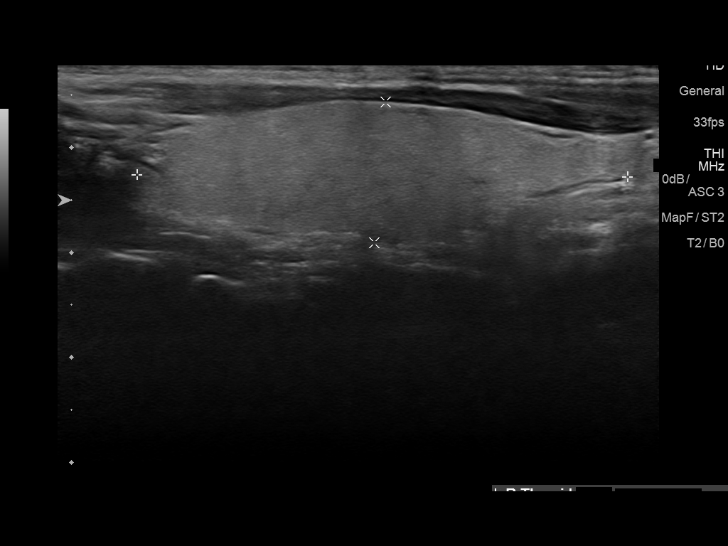
[im 8/45]
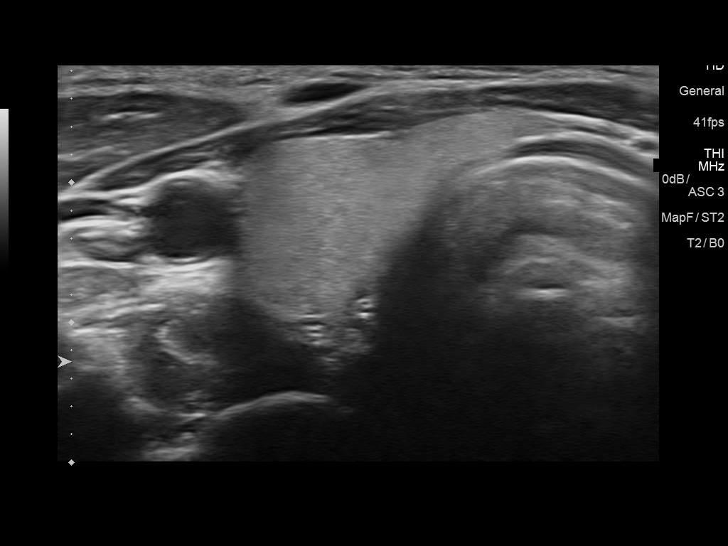
[im 12/45]
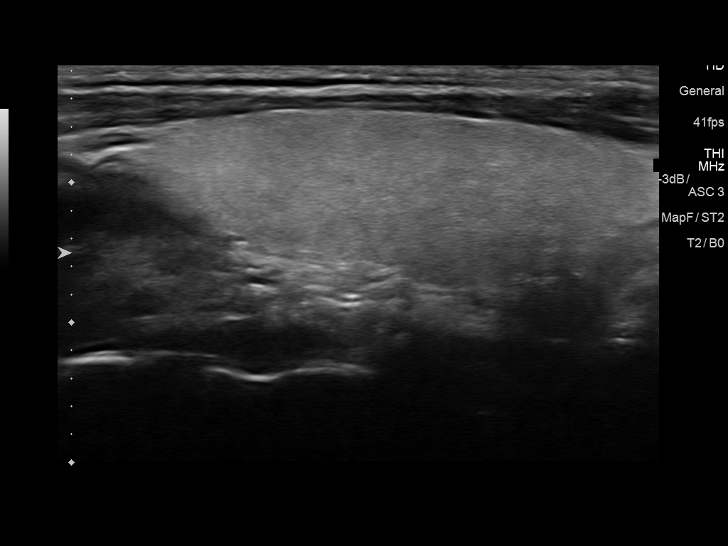
[im 15/45]
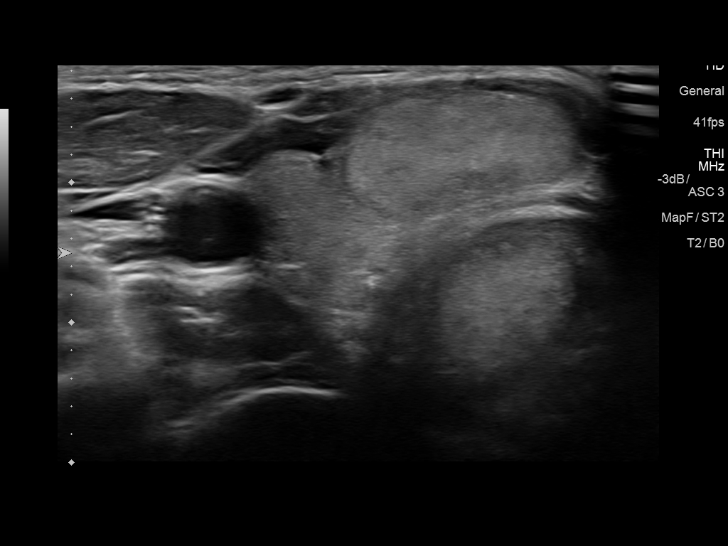
[im 19/45]
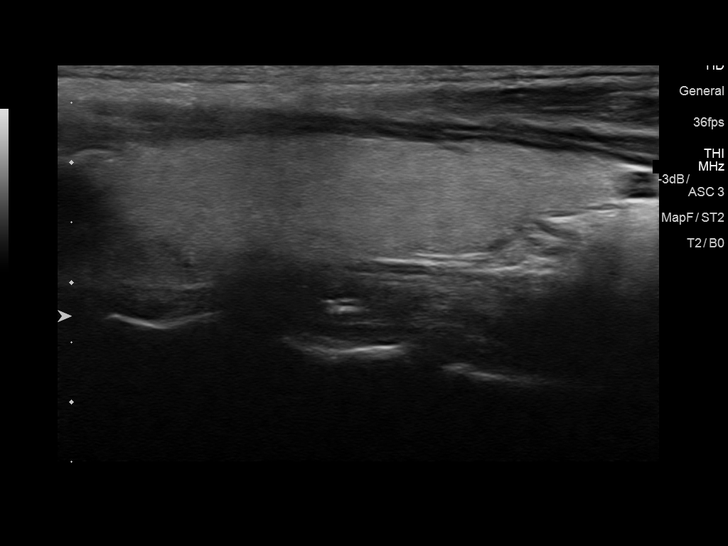
[im 23/45]
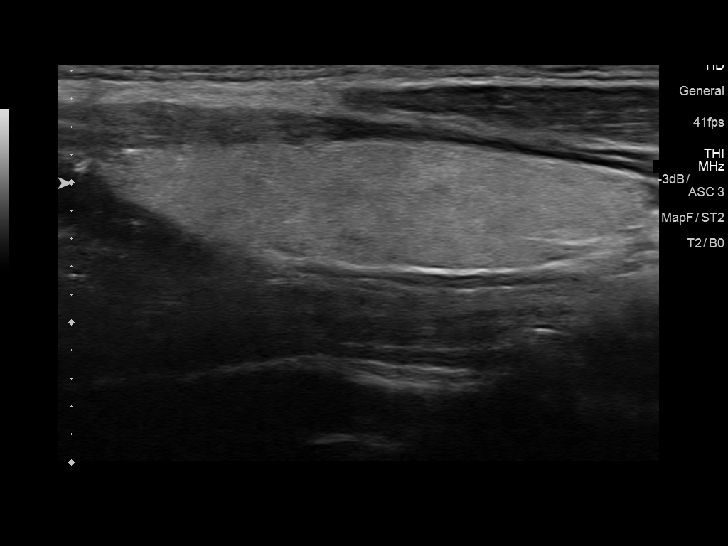
[im 26/45]
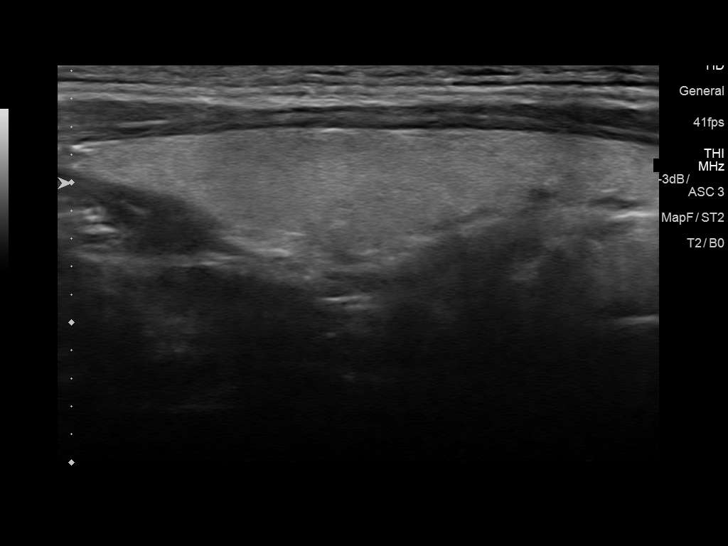
[im 30/45]
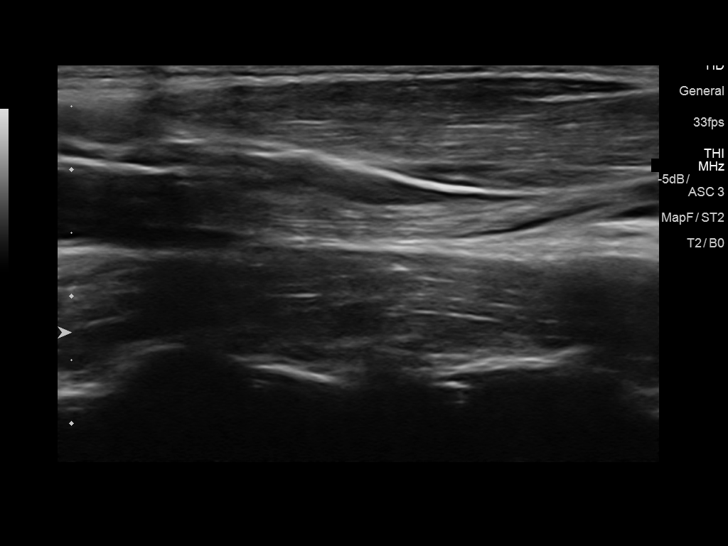
[im 34/45]
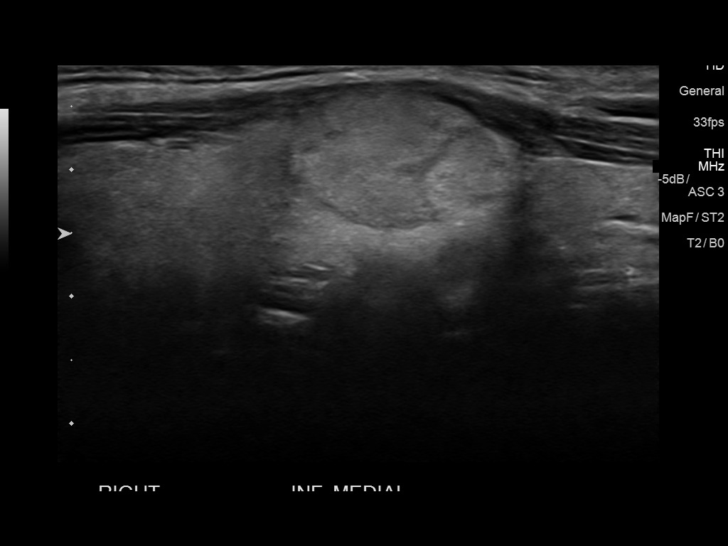
[im 37/45]
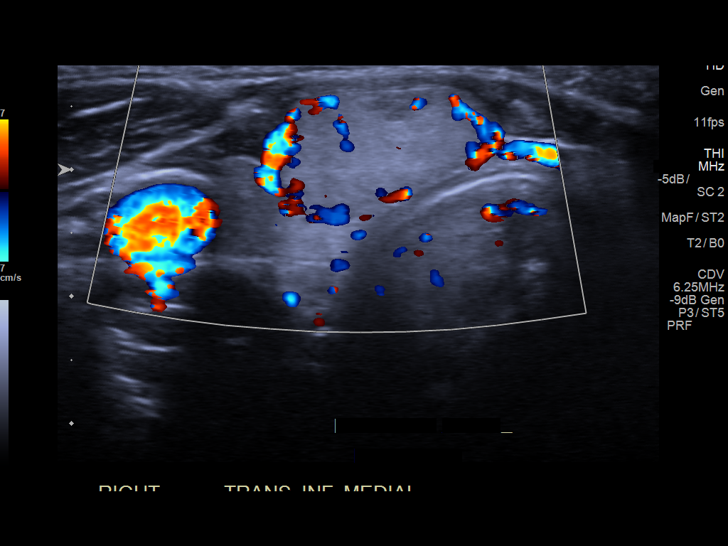
[im 41/45]
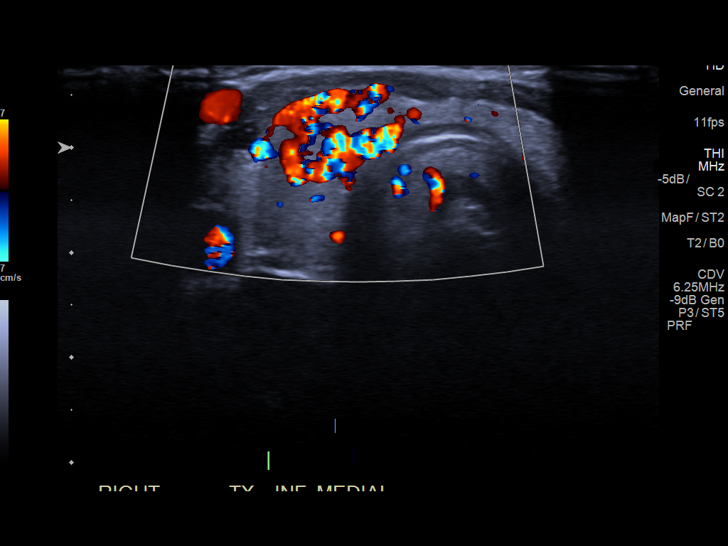
[im 45/45]
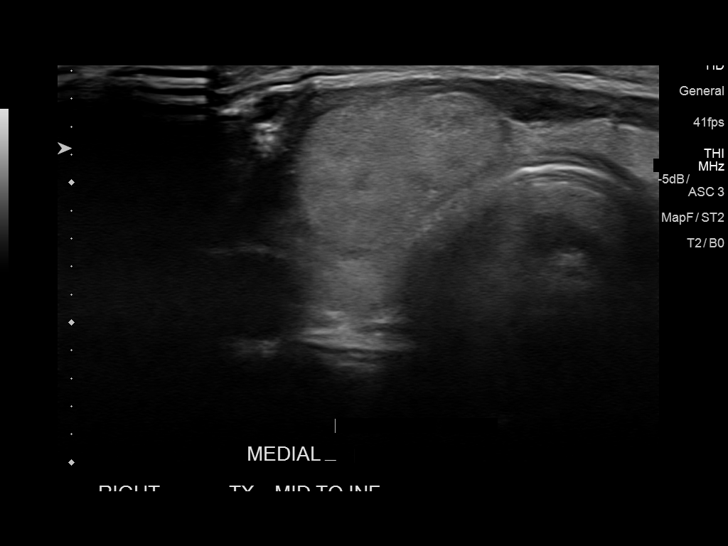

[13 of 25 positions shown; findings below may reference images not displayed]

FINDINGS: Parenchymal Echotexture: Normal

Isthmus: 0.3 cm

Right lobe: 4.7 x 1.4 x 1.3 cm

Left lobe: 4.5 x 1.1 x 1.2 cm

_________________________________________________________

Estimated total number of nodules >/= 1 cm: 1

Number of spongiform nodules >/=  2 cm not described below (TR1): 0

Number of mixed cystic and solid nodules >/= 1.5 cm not described
below (TR2): 0

_________________________________________________________

Nodule # 1:

Location: Isthmus; Mid

Maximum size: 1.9 cm; Other 2 dimensions: 1.6 x 1.0 cm

Composition: solid/almost completely solid (2)

Echogenicity: isoechoic (1)

Shape: not taller-than-wide (0)

Margins: smooth (0)

Echogenic foci: none (0)

ACR TI-RADS total points: 3.

ACR TI-RADS risk category: TR3 (3 points).

ACR TI-RADS recommendations:

*Given size (>/= 1.5 - 2.4 cm) and appearance, a follow-up
ultrasound in 1 year should be considered based on TI-RADS criteria.

_________________________________________________________
IMPRESSION: The right mid isthmus nodule meets criteria for annual follow-up. It
measures 1.9 cm and likely corresponds to the palpable abnormality.

The above is in keeping with the ACR TI-RADS recommendations - [HOSPITAL] 1859;[DATE].

## 2017-11-15 IMAGING — US US RENAL
1 series · 13 of 25 positions shown · non-contrast
Comparison: None.

CLINICAL DATA: Hypertension.  Evaluate for renal artery stenosis.

EXAM:
RENAL/URINARY TRACT ULTRASOUND
RENAL DUPLEX DOPPLER ULTRASOUND

[Series 1: us renal · 0.23mm/px · 13 of 63 slices shown]
[im 1/63]
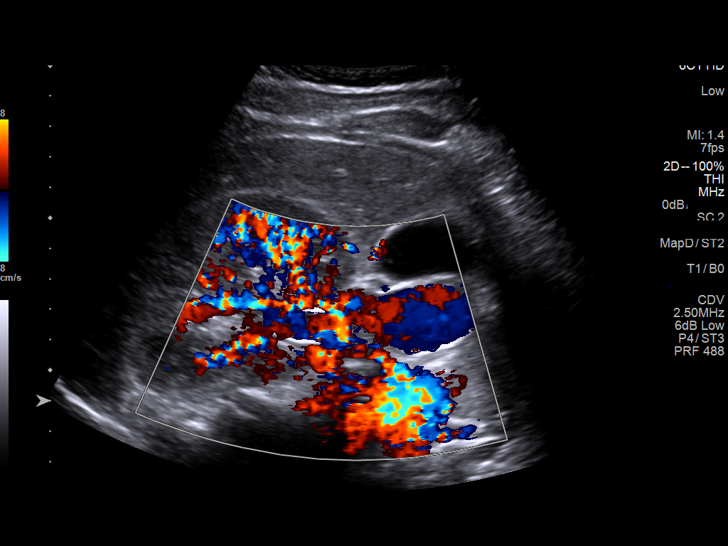
[im 6/63]
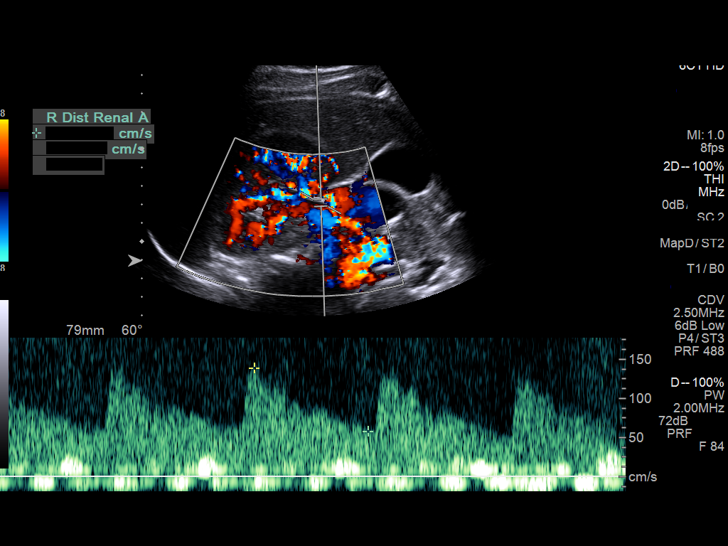
[im 11/63]
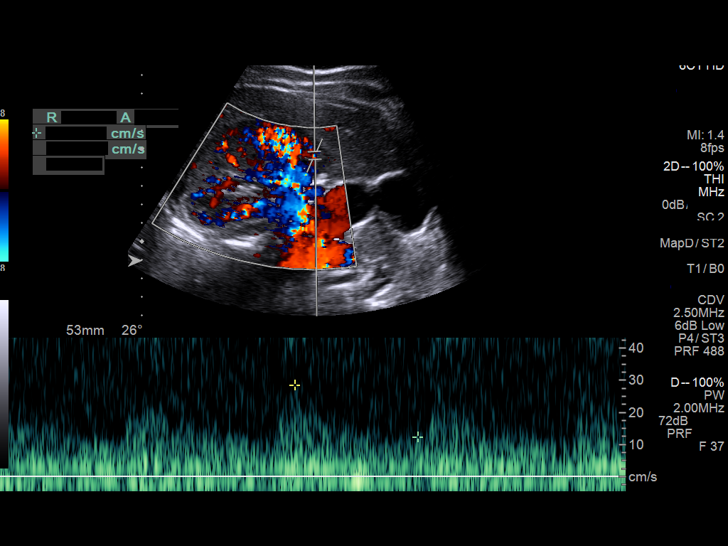
[im 16/63]
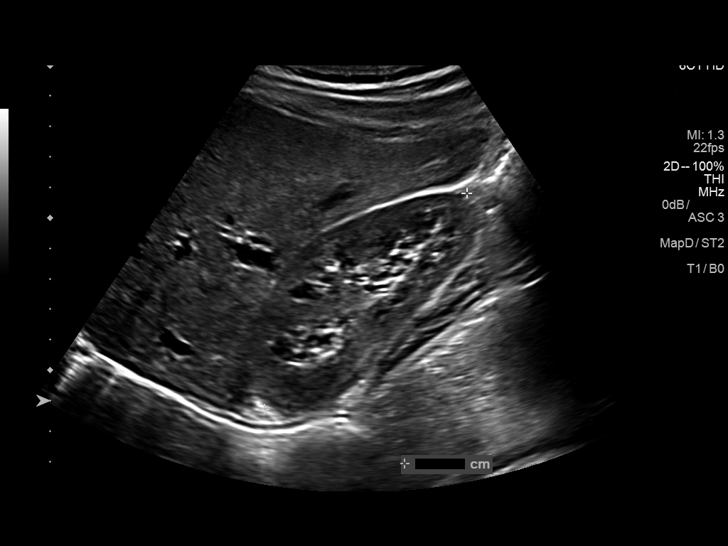
[im 21/63]
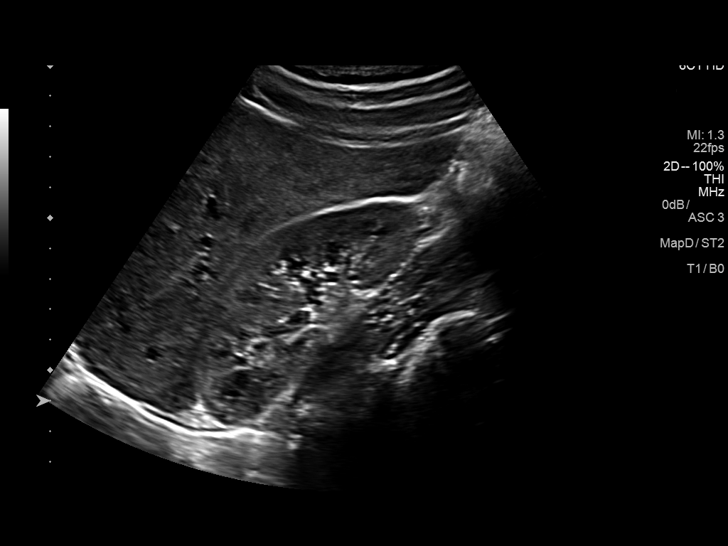
[im 26/63]
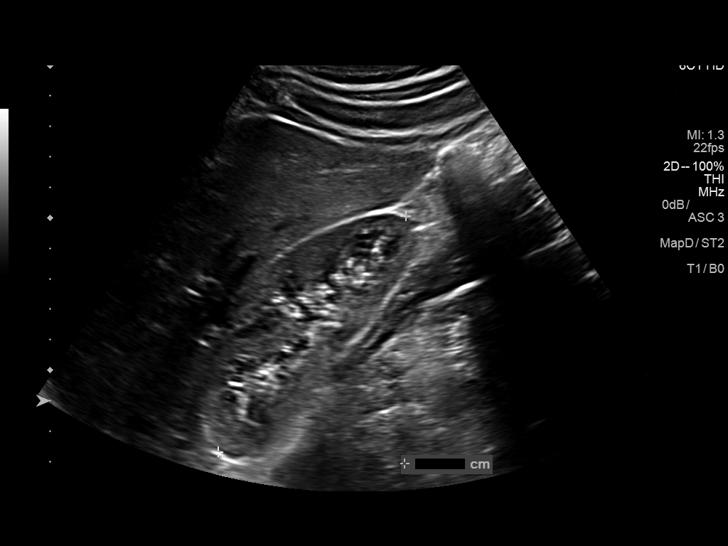
[im 32/63]
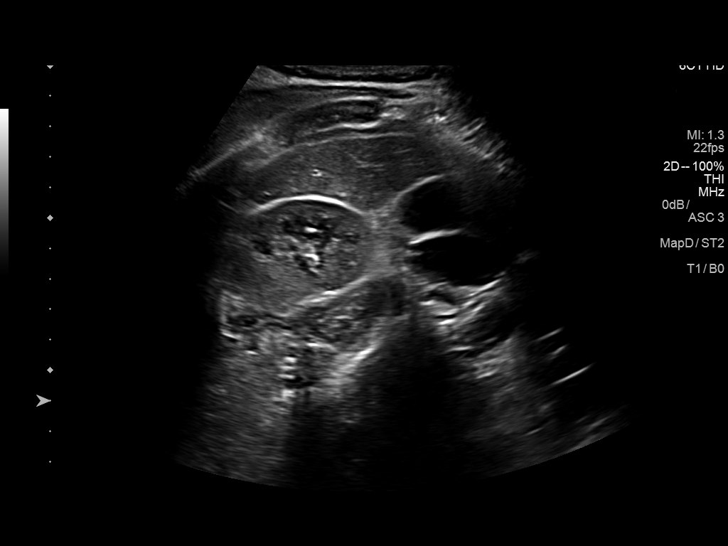
[im 37/63]
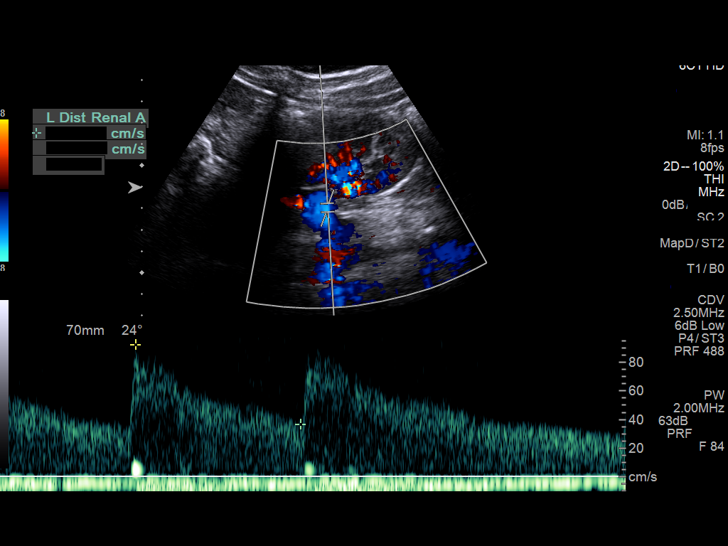
[im 42/63]
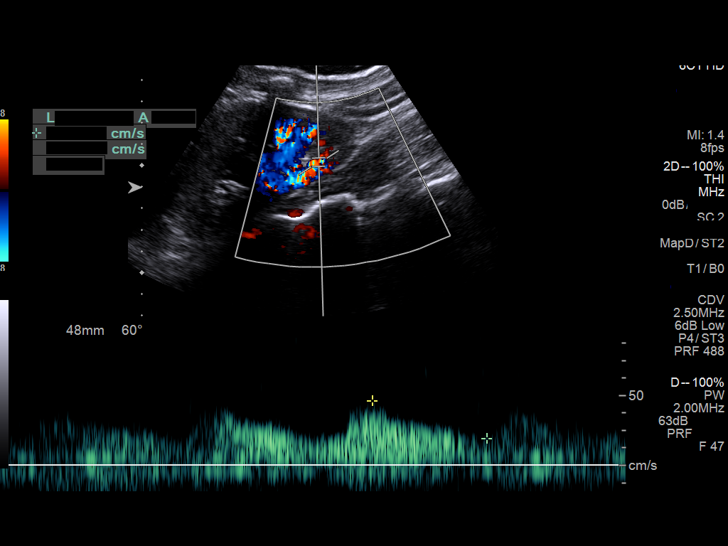
[im 47/63]
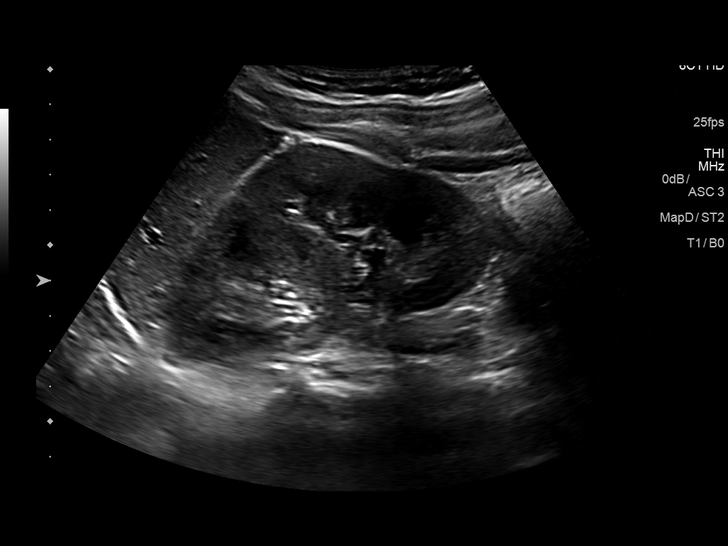
[im 52/63]
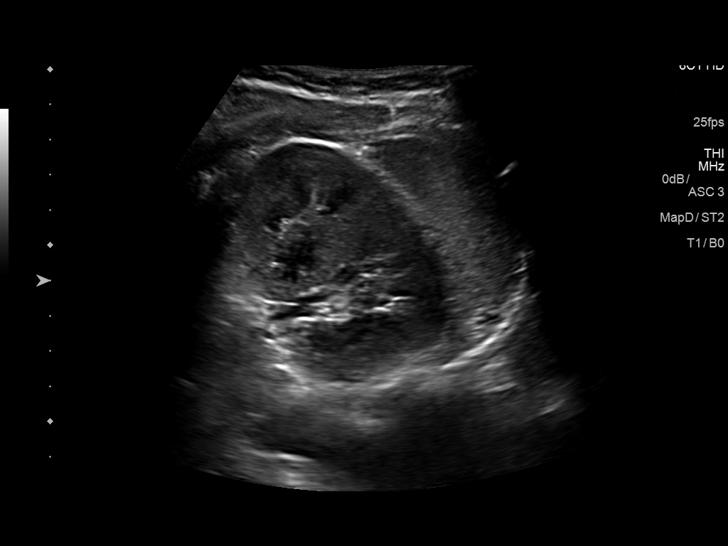
[im 57/63]
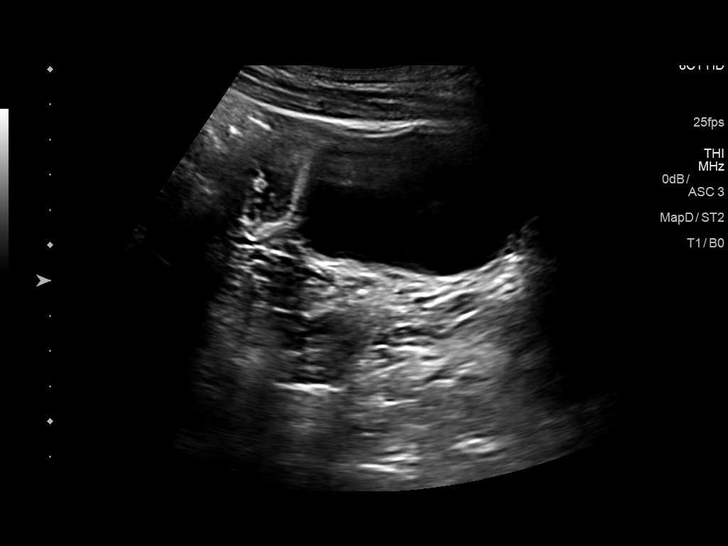
[im 63/63]
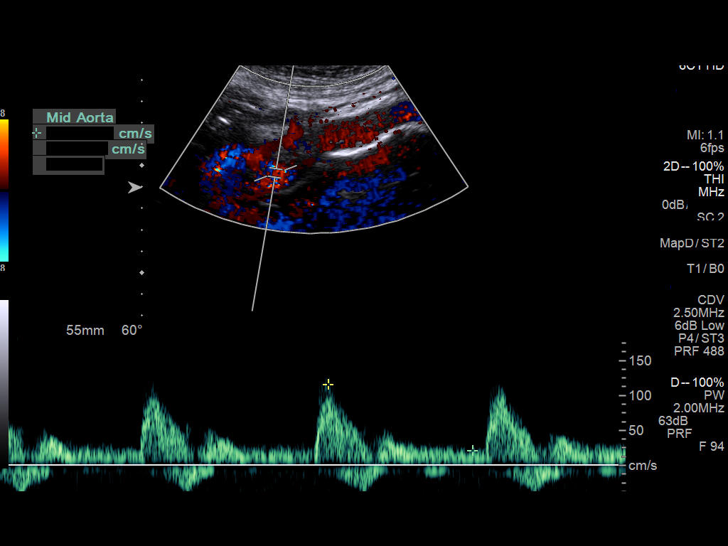

[13 of 25 positions shown; findings below may reference images not displayed]

FINDINGS: Right Kidney:

Normal cortical thickness, echogenicity and size, measuring 9.9 cm
in length. No focal renal lesions. No echogenic renal stones. No
urinary obstruction.

Left Kidney:

Normal cortical thickness, echogenicity and size, measuring 10.7 cm
in length. No focal renal lesions. No echogenic renal stones. No
urinary obstruction.

Bladder:  Appears normal given degree distention.

RENAL DUPLEX ULTRASOUND

Right Renal Artery Velocities:

Origin:  116 cm/sec

Mid:  105 cm/sec

Hilum:  139 cm/sec

Interlobar:  43 cm/sec

Arcuate:  26 Cm/sec

Left Renal Artery Velocities:

Origin:  82 cm/sec

Mid:  121 cm/sec

Hilum:  93 cm/sec

Interlobar:  44 cm/sec

Arcuate:  21 cm/sec

Aortic Velocity:  116 Cm/sec

Right Renal-Aortic Ratios:

Origin:

Mid:

Hilum:

Interlobar:

Arcuate:

Left Renal-Aortic Ratios:

Origin:

Mid:

Hilum:

Interlobar:

Arcuate:

Normal velocities and low resistance waveforms are demonstrated
throughout the bilateral renal artery's renal parenchyma.

The bilateral renal veins appear widely patent.
IMPRESSION: No explanation for patient's hypertension. Specifically, no evidence
of renal artery stenosis.

## 2017-12-05 DIAGNOSIS — Z3042 Encounter for surveillance of injectable contraceptive: Secondary | ICD-10-CM | POA: Diagnosis not present

## 2017-12-10 ENCOUNTER — Other Ambulatory Visit: Payer: Self-pay | Admitting: Obstetrics and Gynecology

## 2017-12-23 NOTE — Patient Instructions (Addendum)
Your procedure is scheduled on: Thursday December 26, 2017 at 7:30 am  Enter through the Main Entrance of [facility] at: 6:00 am  Pick up the phone at the desk and dial [phone].  Call this number if you have problems the morning of surgery: [phone].  Remember: Do NOT eat food or drink any liquids after: Midnight on Wednesday June 12   Take these medicines the morning of surgery with a SIP OF WATER: None   STOP ALL VITAMINS, SUPPLEMENTS, HERBAL MEDICATIONS NOW  DO NOT SMOKE DAY OF SURGERY  Do NOT wear jewelry (body piercing), metal hair clips/bobby pins, make-up, or nail polish. Do NOT wear lotions, powders, or perfumes.  You may wear deoderant. Do NOT shave for 48 hours prior to surgery. Do NOT bring valuables to the hospital. Contacts, dentures, or bridgework may not be worn into surgery.  Have a responsible adult drive you home and stay with you for 24 hours after your procedure

## 2017-12-25 ENCOUNTER — Encounter (HOSPITAL_COMMUNITY)
Admission: RE | Admit: 2017-12-25 | Discharge: 2017-12-25 | Disposition: A | Discharge status: Home / Self Care | Payer: Federal, State, Local not specified - PPO | Source: Ambulatory Visit | Attending: Obstetrics and Gynecology | Admitting: Obstetrics and Gynecology

## 2017-12-25 ENCOUNTER — Other Ambulatory Visit: Payer: Self-pay

## 2017-12-25 ENCOUNTER — Encounter (HOSPITAL_COMMUNITY): Payer: Self-pay

## 2017-12-25 DIAGNOSIS — Z01812 Encounter for preprocedural laboratory examination: Secondary | ICD-10-CM | POA: Diagnosis not present

## 2017-12-25 LAB — CBC
HEMATOCRIT: 30.2 % — AB (ref 36.0–46.0)
HEMOGLOBIN: 9.2 g/dL — AB (ref 12.0–15.0)
MCH: 22.9 pg — AB (ref 26.0–34.0)
MCHC: 30.5 g/dL (ref 30.0–36.0)
MCV: 75.3 fL — ABNORMAL LOW (ref 78.0–100.0)
Platelets: 377 10*3/uL (ref 150–400)
RBC: 4.01 MIL/uL (ref 3.87–5.11)
RDW: 16.4 % — AB (ref 11.5–15.5)
WBC: 4.6 10*3/uL (ref 4.0–10.5)

## 2017-12-26 ENCOUNTER — Other Ambulatory Visit: Payer: Self-pay

## 2017-12-26 ENCOUNTER — Ambulatory Visit (HOSPITAL_COMMUNITY): Payer: Federal, State, Local not specified - PPO | Admitting: Anesthesiology

## 2017-12-26 ENCOUNTER — Encounter (HOSPITAL_COMMUNITY)
Admission: AD | Disposition: A | Discharge status: Home / Self Care | Payer: Self-pay | Source: Ambulatory Visit | Attending: Obstetrics and Gynecology

## 2017-12-26 ENCOUNTER — Encounter (HOSPITAL_COMMUNITY): Payer: Self-pay

## 2017-12-26 ENCOUNTER — Ambulatory Visit (HOSPITAL_COMMUNITY)
Admission: AD | Admit: 2017-12-26 | Discharge: 2017-12-26 | Disposition: A | Discharge status: Home / Self Care | Payer: Federal, State, Local not specified - PPO | Source: Ambulatory Visit | Attending: Obstetrics and Gynecology | Admitting: Obstetrics and Gynecology

## 2017-12-26 DIAGNOSIS — I10 Essential (primary) hypertension: Secondary | ICD-10-CM | POA: Insufficient documentation

## 2017-12-26 DIAGNOSIS — N840 Polyp of corpus uteri: Secondary | ICD-10-CM | POA: Diagnosis not present

## 2017-12-26 DIAGNOSIS — R51 Headache: Secondary | ICD-10-CM | POA: Diagnosis not present

## 2017-12-26 DIAGNOSIS — D250 Submucous leiomyoma of uterus: Secondary | ICD-10-CM | POA: Diagnosis not present

## 2017-12-26 DIAGNOSIS — Z79899 Other long term (current) drug therapy: Secondary | ICD-10-CM | POA: Diagnosis not present

## 2017-12-26 DIAGNOSIS — D649 Anemia, unspecified: Secondary | ICD-10-CM | POA: Diagnosis not present

## 2017-12-26 DIAGNOSIS — Z842 Family history of other diseases of the genitourinary system: Secondary | ICD-10-CM | POA: Insufficient documentation

## 2017-12-26 DIAGNOSIS — E890 Postprocedural hypothyroidism: Secondary | ICD-10-CM | POA: Diagnosis not present

## 2017-12-26 DIAGNOSIS — Z8249 Family history of ischemic heart disease and other diseases of the circulatory system: Secondary | ICD-10-CM | POA: Diagnosis not present

## 2017-12-26 DIAGNOSIS — Z832 Family history of diseases of the blood and blood-forming organs and certain disorders involving the immune mechanism: Secondary | ICD-10-CM | POA: Diagnosis not present

## 2017-12-26 DIAGNOSIS — N938 Other specified abnormal uterine and vaginal bleeding: Secondary | ICD-10-CM | POA: Diagnosis not present

## 2017-12-26 DIAGNOSIS — D6489 Other specified anemias: Secondary | ICD-10-CM | POA: Diagnosis not present

## 2017-12-26 HISTORY — PX: DILATATION & CURETTAGE/HYSTEROSCOPY WITH MYOSURE: SHX6511

## 2017-12-26 SURGERY — DILATATION & CURETTAGE/HYSTEROSCOPY WITH MYOSURE
Anesthesia: General | Site: Uterus

## 2017-12-26 MED ORDER — KETOROLAC TROMETHAMINE 30 MG/ML IJ SOLN
INTRAMUSCULAR | Status: DC | PRN
  Administered 2017-12-26: 30 mg via INTRAVENOUS
  Administered 2017-12-26: 30 mg via INTRAMUSCULAR

## 2017-12-26 MED ORDER — PROPOFOL 10 MG/ML IV BOLUS
INTRAVENOUS | Status: DC | PRN
  Administered 2017-12-26: 140 mg via INTRAVENOUS

## 2017-12-26 MED ORDER — LACTATED RINGERS IV SOLN
INTRAVENOUS | Status: DC
  Administered 2017-12-26: 08:00:00 via INTRAVENOUS
  Administered 2017-12-26: 125 mL/h via INTRAVENOUS

## 2017-12-26 MED ORDER — FENTANYL CITRATE (PF) 250 MCG/5ML IJ SOLN
INTRAMUSCULAR | Status: AC
  Filled 2017-12-26: qty 5

## 2017-12-26 MED ORDER — FENTANYL CITRATE (PF) 100 MCG/2ML IJ SOLN
INTRAMUSCULAR | Status: DC | PRN
  Administered 2017-12-26 (×2): 50 ug via INTRAVENOUS

## 2017-12-26 MED ORDER — MIDAZOLAM HCL 2 MG/2ML IJ SOLN
INTRAMUSCULAR | Status: AC
  Filled 2017-12-26: qty 2

## 2017-12-26 MED ORDER — SCOPOLAMINE 1 MG/3DAYS TD PT72
MEDICATED_PATCH | TRANSDERMAL | Status: AC
  Filled 2017-12-26: qty 1

## 2017-12-26 MED ORDER — ONDANSETRON HCL 4 MG/2ML IJ SOLN
INTRAMUSCULAR | Status: DC | PRN
  Administered 2017-12-26: 4 mg via INTRAVENOUS

## 2017-12-26 MED ORDER — MIDAZOLAM HCL 2 MG/2ML IJ SOLN
INTRAMUSCULAR | Status: DC | PRN
  Administered 2017-12-26: 1 mg via INTRAVENOUS

## 2017-12-26 MED ORDER — KETOROLAC TROMETHAMINE 30 MG/ML IJ SOLN
INTRAMUSCULAR | Status: AC
  Filled 2017-12-26: qty 1

## 2017-12-26 MED ORDER — MEPERIDINE HCL 25 MG/ML IJ SOLN
INTRAMUSCULAR | Status: AC
  Filled 2017-12-26: qty 1

## 2017-12-26 MED ORDER — FENTANYL CITRATE (PF) 100 MCG/2ML IJ SOLN
INTRAMUSCULAR | Status: AC
  Administered 2017-12-26: 50 ug via INTRAVENOUS
  Filled 2017-12-26: qty 2

## 2017-12-26 MED ORDER — PROMETHAZINE HCL 25 MG/ML IJ SOLN: 6.2500 mg | INTRAMUSCULAR | Status: DC | PRN

## 2017-12-26 MED ORDER — LIDOCAINE HCL (CARDIAC) PF 100 MG/5ML IV SOSY
PREFILLED_SYRINGE | INTRAVENOUS | Status: DC | PRN
  Administered 2017-12-26: 50 mg via INTRAVENOUS

## 2017-12-26 MED ORDER — GLYCOPYRROLATE 0.2 MG/ML IJ SOLN
INTRAMUSCULAR | Status: DC | PRN
Start: 2017-12-26 — End: 2017-12-26
  Administered 2017-12-26: 0.1 mg via INTRAVENOUS

## 2017-12-26 MED ORDER — ONDANSETRON HCL 4 MG/2ML IJ SOLN
INTRAMUSCULAR | Status: AC
Start: 2017-12-26 — End: ?
  Filled 2017-12-26: qty 2

## 2017-12-26 MED ORDER — KETOROLAC TROMETHAMINE 30 MG/ML IJ SOLN
INTRAMUSCULAR | Status: AC
Start: 2017-12-26 — End: ?
  Filled 2017-12-26: qty 1

## 2017-12-26 MED ORDER — FENTANYL CITRATE (PF) 100 MCG/2ML IJ SOLN
25.0000 ug | INTRAMUSCULAR | Status: DC | PRN
  Administered 2017-12-26 (×2): 50 ug via INTRAVENOUS

## 2017-12-26 MED ORDER — MEPERIDINE HCL 25 MG/ML IJ SOLN
6.2500 mg | INTRAMUSCULAR | Status: DC | PRN
  Administered 2017-12-26 (×2): 12.5 mg via INTRAVENOUS

## 2017-12-26 MED ORDER — SODIUM CHLORIDE 0.9 % IR SOLN
Status: DC | PRN
  Administered 2017-12-26: 2500 mL

## 2017-12-26 MED ORDER — SCOPOLAMINE 1 MG/3DAYS TD PT72
1.0000 | MEDICATED_PATCH | Freq: Once | TRANSDERMAL | Status: DC
  Administered 2017-12-26: 1.5 mg via TRANSDERMAL

## 2017-12-26 MED ORDER — DEXAMETHASONE SODIUM PHOSPHATE 4 MG/ML IJ SOLN
INTRAMUSCULAR | Status: DC | PRN
  Administered 2017-12-26: 8 mg via INTRAVENOUS

## 2017-12-26 MED ORDER — IBUPROFEN 800 MG PO TABS: 800.0000 mg | ORAL_TABLET | Freq: Three times a day (TID) | ORAL | 5 refills | Status: AC | PRN

## 2017-12-26 MED ORDER — GLYCOPYRROLATE 0.2 MG/ML IJ SOLN
INTRAMUSCULAR | Status: AC
  Filled 2017-12-26: qty 1

## 2017-12-26 MED ORDER — LIDOCAINE HCL (CARDIAC) PF 100 MG/5ML IV SOSY
PREFILLED_SYRINGE | INTRAVENOUS | Status: AC
  Filled 2017-12-26: qty 5

## 2017-12-26 MED ORDER — MIDAZOLAM HCL 2 MG/2ML IJ SOLN: 0.5000 mg | Freq: Once | INTRAMUSCULAR | Status: DC | PRN

## 2017-12-26 MED ORDER — DEXAMETHASONE SODIUM PHOSPHATE 10 MG/ML IJ SOLN
INTRAMUSCULAR | Status: AC
Start: 2017-12-26 — End: ?
  Filled 2017-12-26: qty 1

## 2017-12-26 MED ORDER — PROPOFOL 10 MG/ML IV BOLUS
INTRAVENOUS | Status: AC
  Filled 2017-12-26: qty 40

## 2017-12-26 SURGICAL SUPPLY — 16 items
CANISTER SUCT 3000ML PPV (MISCELLANEOUS) ×2 IMPLANT
CATH ROBINSON RED A/P 16FR (CATHETERS) ×2 IMPLANT
FILTER ARTHROSCOPY CONVERTOR (FILTER) ×2 IMPLANT
GLOVE BIOGEL PI IND STRL 7.0 (GLOVE) ×2 IMPLANT
GLOVE BIOGEL PI INDICATOR 7.0 (GLOVE) ×2
GLOVE ECLIPSE 6.5 STRL STRAW (GLOVE) ×2 IMPLANT
GLOVE SURG SS PI 7.0 STRL IVOR (GLOVE) ×4 IMPLANT
GOWN STRL REUS W/TWL LRG LVL3 (GOWN DISPOSABLE) ×4 IMPLANT
MYOSURE XL FIBROID REM (MISCELLANEOUS) ×2
PACK VAGINAL MINOR WOMEN LF (CUSTOM PROCEDURE TRAY) ×2 IMPLANT
PAD OB MATERNITY 4.3X12.25 (PERSONAL CARE ITEMS) ×2 IMPLANT
SEAL ROD LENS SCOPE MYOSURE (ABLATOR) ×2 IMPLANT
SYSTEM TISS REMOVAL MYSR XL RM (MISCELLANEOUS) ×1 IMPLANT
TOWEL OR 17X24 6PK STRL BLUE (TOWEL DISPOSABLE) ×4 IMPLANT
TUBING AQUILEX INFLOW (TUBING) ×2 IMPLANT
TUBING AQUILEX OUTFLOW (TUBING) ×2 IMPLANT

## 2017-12-26 NOTE — Op Note (Signed)
NAME: ALEC, MCPHEE MEDICAL RECORD [id number] ACCOUNT [id number] DATE OF BIRTH:[birth year] FACILITY: [facility] LOCATION: [facility]PERIOP PHYSICIAN:Derinda Bartus A. Nels Munn, MD  OPERATIVE REPORT  DATE OF PROCEDURE:  12/26/2017  PREOPERATIVE DIAGNOSIS:  Breakthrough bleeding on Depo-Provera,  endometrial masses, fibroid uterus.  POSTOPERATIVE DIAGNOSIS:  Submucosal fibroid, breakthrough bleeding on Depo-Provera.  PROCEDURE PERFORMED:  Diagnostic hysteroscopy, hysteroscopic resection of submucosal fibroid, dilation and curettage.  ANESTHESIA:  General.  SURGEON:   Suki Crockett A. Garwin Brothers, MD   ASSISTANT:  None.  DESCRIPTION OF PROCEDURE:  Under adequate general anesthesia, the patient was placed in dorsal lithotomy position.  She was sterilely prepped and draped in the usual fashion.  The bladder was catheterized and a moderate amount of urine.  Examination under anesthesia revealed an enlarged retroverted uterus.  No adnexal masses could be appreciated.  Bivalve speculum was placed in the vagina.  Single tooth tenaculum was placed on the anterior lip of the cervix.  The cervix was then serially dilated up  to #21 [facility] dilator.  A diagnostic hysteroscope was introduced into the uterine cavity.  Endometrium thickened throughout was noted with a posterior right submucosal fibroid.  Both tubal ostia could be seen.   The hysteroscope was removed.  The cervix was then further dilated up to #25 [facility] dilator and the MyoSure resectoscope apparatus was inserted into the uterine cavity.  The XL MyoSure resectoscope was used to resect the submucosal fibroid as well as the endometrial cavity.  When the procedure was felt to be complete, the intracavitary pressure was reduced.  No bulge was noted at that posterior resected fibroid area.  The instruments were therefore removed.  Endocervical canal was without any lesions.    SPECIMEN:  Fibroid resection with endometrial curettings sent to pathology.    ESTIMATED  BLOOD LOSS:  Minimal.    FLUID DEFICIT:  200 mL.  COMPLICATIONS:  None  The patient tolerated the procedure well and was transferred to recovery room in stable condition  AN/NUANCE  D:12/26/2017 T:12/26/2017 JOB:[id number]

## 2017-12-26 NOTE — Transfer of Care (Signed)
Immediate Anesthesia Transfer of Care Note  Patient: Lori Costa  Procedure(s) Performed: DILATATION & CURETTAGE/HYSTEROSCOPY WITH MYOSURE (N/A Uterus)  Patient Location: PACU  Anesthesia Type:General  Level of Consciousness: awake and patient cooperative  Airway & Oxygen Therapy: Patient Spontanous Breathing and Patient connected to nasal cannula oxygen  Post-op Assessment: Report given to RN and Post -op Vital signs reviewed and stable  Post vital signs: Reviewed and stable  Last Vitals:  Vitals Value Taken Time  BP    Temp    Pulse 64 12/26/2017  8:16 AM  Resp 8 12/26/2017  8:16 AM  SpO2 100 % 12/26/2017  8:16 AM  Vitals shown include unvalidated device data.  Last Pain:  Vitals:   12/26/17 0633  TempSrc: Oral  PainSc: 0-No pain      Patients Stated Pain Goal: 3 (84/16/60 6301)  Complications: No apparent anesthesia complications

## 2017-12-26 NOTE — Brief Op Note (Signed)
12/26/2017  8:26 AM  PATIENT:  Lori Costa  27 y.o. female  PRE-OPERATIVE DIAGNOSIS:  Endometrial Polyps, Break Through Bleeding on Depo Provera  POST-OPERATIVE DIAGNOSIS:  Submucosal fibroid, Break Through Bleeding on Depo Provera  PROCEDURE:  Diagnostic hysteroscopy, hysteroscopic resection of SM fibroid, dilation and curettage  SURGEON:  Surgeon(s) and Role:    * Servando Salina, MD - Primary  PHYSICIAN ASSISTANT:   ASSISTANTS: none   ANESTHESIA:   general Findings; thickened endometrium, posterior SM fibroid, tubal ostia seen EBL:  20 mL   BLOOD ADMINISTERED:none  DRAINS: none   LOCAL MEDICATIONS USED:  NONE  SPECIMEN:  Source of Specimen:  fibroid resection, emc  DISPOSITION OF SPECIMEN:  PATHOLOGY  COUNTS:  YES  TOURNIQUET:  * No tourniquets in log *  DICTATION: .Other Dictation: Dictation Number [id number]  PLAN OF CARE: Discharge to home after PACU  PATIENT DISPOSITION:  PACU - hemodynamically stable.   Delay start of Pharmacological VTE agent (>24hrs) due to surgical blood loss or risk of bleeding: no

## 2017-12-26 NOTE — Discharge Instructions (Signed)
CALL  IF TEMP>100.4, NOTHING PER VAGINA X 2 WK, CALL IF SOAKING A MAXI  PAD EVERY HOUR OR MORE FREQUENTLY  DISCHARGE INSTRUCTIONS: D&C / D&E The following instructions have been prepared to help you care for yourself upon your return home.   Personal hygiene: . Use sanitary pads for vaginal drainage, not tampons. . Shower the day after your procedure. . NO tub baths, pools or Jacuzzis for 2-3 weeks. . Wipe front to back after using the bathroom.  Activity and limitations: . Do NOT drive or operate any equipment for 24 hours. The effects of anesthesia are still present and drowsiness may result. . Do NOT rest in bed all day. . Walking is encouraged. . Walk up and down stairs slowly. . You may resume your normal activity in one to two days or as indicated by your physician.  Sexual activity: NO intercourse for at least 2 weeks after the procedure, or as indicated by your physician.  Diet: Eat a light meal as desired this evening. You may resume your usual diet tomorrow.  Return to work: You may resume your work activities in one to two days or as indicated by your doctor.  What to expect after your surgery: Expect to have vaginal bleeding/discharge for 2-3 days and spotting for up to 10 days. It is not unusual to have soreness for up to 1-2 weeks. You may have a slight burning sensation when you urinate for the first day. Mild cramps may continue for a couple of days. You may have a regular period in 2-6 weeks.  Call your doctor for any of the following: . Excessive vaginal bleeding, saturating and changing one pad every hour. . Inability to urinate 6 hours after discharge from hospital. . Pain not relieved by pain medication. . Fever of 100.4 F or greater. . Unusual vaginal discharge or odor.   Post Anesthesia Home Care Instructions  Activity: Get plenty of rest for the remainder of the day. A responsible individual must stay with you for 24 hours following the procedure.  For  the next 24 hours, DO NOT: -Drive a car -Operate machinery -Drink alcoholic beverages -Take any medication unless instructed by your physician -Make any legal decisions or sign important papers.  Meals: Start with liquid foods such as gelatin or soup. Progress to regular foods as tolerated. Avoid greasy, spicy, heavy foods. If nausea and/or vomiting occur, drink only clear liquids until the nausea and/or vomiting subsides. Call your physician if vomiting continues.  Special Instructions/Symptoms: Your throat may feel dry or sore from the anesthesia or the breathing tube placed in your throat during surgery. If this causes discomfort, gargle with warm salt water. The discomfort should disappear within 24 hours.  If you had a scopolamine patch placed behind your ear for the management of post- operative nausea and/or vomiting:  1. The medication in the patch is effective for 72 hours, after which it should be removed.  Wrap patch in a tissue and discard in the trash. Wash hands thoroughly with soap and water. 2. You may remove the patch earlier than 72 hours if you experience unpleasant side effects which may include dry mouth, dizziness or visual disturbances. 3. Avoid touching the patch. Wash your hands with soap and water after contact with the patch.      

## 2017-12-26 NOTE — Anesthesia Postprocedure Evaluation (Signed)
Anesthesia Post Note  Patient: Guinevere Stephenson  Procedure(s) Performed: DILATATION & CURETTAGE/HYSTEROSCOPY WITH MYOSURE (N/A Uterus)     Patient location during evaluation: PACU Anesthesia Type: General Level of consciousness: awake and alert, oriented and patient cooperative Pain management: pain level controlled Vital Signs Assessment: post-procedure vital signs reviewed and stable Respiratory status: spontaneous breathing, nonlabored ventilation and respiratory function stable Cardiovascular status: blood pressure returned to baseline and stable Postop Assessment: no apparent nausea or vomiting Anesthetic complications: no    Last Vitals:  Vitals:   12/26/17 0900 12/26/17 0909  BP: 125/87 127/82  Pulse: 65 61  Resp: 13 19  Temp:    SpO2: 96% 98%    Last Pain:  Vitals:   12/26/17 0909  TempSrc:   PainSc: 2    Pain Goal: Patients Stated Pain Goal: 3 (12/26/17 4734)               Seleta Rhymes. Kyree Fedorko

## 2017-12-26 NOTE — Anesthesia Preprocedure Evaluation (Signed)
Anesthesia Evaluation  Patient identified by MRN, date of birth, ID band Patient awake    Reviewed: Allergy & Precautions, NPO status , Patient's Chart, lab work & pertinent test results  History of Anesthesia Complications Negative for: history of anesthetic complications  Airway Mallampati: I  TM Distance: >3 FB Neck ROM: Full    Dental  (+) Dental Advisory Given   Pulmonary neg pulmonary ROS,    breath sounds clear to auscultation       Cardiovascular hypertension,  Rhythm:Regular Rate:Normal     Neuro/Psych negative neurological ROS     GI/Hepatic negative GI ROS, Neg liver ROS,   Endo/Other  negative endocrine ROS  Renal/GU negative Renal ROS     Musculoskeletal   Abdominal   Peds  Hematology  (+) Blood dyscrasia (Hb 9.2), anemia ,   Anesthesia Other Findings   Reproductive/Obstetrics Depo. LMP 12/05/17                             Anesthesia Physical Anesthesia Plan  ASA: II  Anesthesia Plan: General   Post-op Pain Management:    Induction: Intravenous  PONV Risk Score and Plan: 3 and Ondansetron, Dexamethasone and Scopolamine patch - Pre-op  Airway Management Planned: LMA  Additional Equipment:   Intra-op Plan:   Post-operative Plan:   Informed Consent: I have reviewed the patients History and Physical, chart, labs and discussed the procedure including the risks, benefits and alternatives for the proposed anesthesia with the patient or authorized representative who has indicated his/her understanding and acceptance.   Dental advisory given  Plan Discussed with: CRNA and Surgeon  Anesthesia Plan Comments: (Plan routine monitors, GA- LMA OK)        Anesthesia Quick Evaluation

## 2017-12-26 NOTE — Anesthesia Procedure Notes (Signed)
Procedure Name: LMA Insertion Date/Time: 12/26/2017 7:29 AM Performed by: Georgeanne Nim, CRNA Pre-anesthesia Checklist: Patient identified, Emergency Drugs available, Suction available, Patient being monitored and Timeout performed Patient Re-evaluated:Patient Re-evaluated prior to induction Oxygen Delivery Method: Circle system utilized Preoxygenation: Pre-oxygenation with 100% oxygen Induction Type: IV induction Ventilation: Mask ventilation without difficulty LMA: LMA inserted LMA Size: 4.0 Number of attempts: 1 Placement Confirmation: positive ETCO2,  CO2 detector and breath sounds checked- equal and bilateral Tube secured with: Tape Dental Injury: Teeth and Oropharynx as per pre-operative assessment

## 2017-12-26 NOTE — H&P (Signed)
Lori Costa is an 26 y.o. female. G0  BF presents for surgical mgmt of DUB/BTB on DMPA with findings of endometrial masses on sonohysterogram done for evaluation of above problem. sono did reveal fibroid uterus  Pertinent Gynecological History: Menses: regular every month without intermenstrual spotting Bleeding: menorrhagia/dub Contraception: Depo-Provera injections and OCP (estrogen/progesterone) DES exposure: denies Blood transfusions: none Sexually transmitted diseases: no past history Previous GYN Procedures: n/a  Last pap: normal Date:09/24/2016 OB History: G0   Menstrual History: Menarche age: n/a No LMP recorded.    Past Medical History:  Diagnosis Date  . Anemia    " slight; no meds"  . Headache   . Hypertension    hereditary  . Thyroid nodule    right    Past Surgical History:  Procedure Laterality Date  . THYROID LOBECTOMY Right 07/11/2017   Procedure: EXCISION RIGHT THYROID NODULE;  Surgeon: Coralie Keens, MD;  Location: [place];  Service: General;  Laterality: Right;  . WISDOM TOOTH EXTRACTION      Family History  Problem Relation Age of Onset  . Fibroids Mother   . Anemia Mother   . Hypertension Father   . Thyroid disease Neg Hx     Social History:  reports that she has never smoked. She has never used smokeless tobacco. She reports that she does not drink alcohol or use drugs.  Allergies: No Known Allergies  Medications Prior to Admission  Medication Sig Dispense Refill Last Dose  . medroxyPROGESTERone (DEPO-PROVERA) 150 MG/ML injection Inject 150 mg into the muscle every 3 (three) months.   Past Week at Unknown time  . oxyCODONE (OXY IR/ROXICODONE) 5 MG immediate release tablet Take 1-2 tablets (5-10 mg total) by mouth every 6 (six) hours as needed for moderate pain, severe pain or breakthrough pain. (Patient not taking: Reported on 12/19/2017) 30 tablet 0 Not Taking at Unknown time    Review of Systems  All other systems reviewed and are  negative.   Blood pressure (!) 133/98, pulse 76, temperature 98.5 F (36.9 C), temperature source Oral, resp. rate 16, SpO2 100 %. Physical Exam  Constitutional: She is oriented to person, place, and time. She appears well-developed and well-nourished.  HENT:  Head: Atraumatic.  Eyes: EOM are normal.  Cardiovascular: Regular rhythm.  Respiratory: Breath sounds normal.  GI: Bowel sounds are normal.  Genitourinary:  Genitourinary Comments: Vulva nl Cervix nl  adnexa no palp mass  uterus enlarged irregular  Neurological: She is alert and oriented to person, place, and time.  Skin: Skin is warm and dry.    Results for orders placed or performed during the hospital encounter of 12/25/17 (from the past 24 hour(s))  CBC     Status: Abnormal   Collection Time: 12/25/17  2:55 PM  Result Value Ref Range   WBC 4.6 4.0 - 10.5 K/uL   RBC 4.01 3.87 - 5.11 MIL/uL   Hemoglobin 9.2 (L) 12.0 - 15.0 g/dL   HCT 30.2 (L) 36.0 - 46.0 %   MCV 75.3 (L) 78.0 - 100.0 fL   MCH 22.9 (L) 26.0 - 34.0 pg   MCHC 30.5 30.0 - 36.0 g/dL   RDW 16.4 (H) 11.5 - 15.5 %   Platelets 377 150 - 400 K/uL    No results found.  Assessment/Plan: BTB on DMPA Endometrial masses Endometrial thickening on sonogram P)dx hysteroscopy, D&C , resection of endometrial masses. Risk of procedure includes infection , bleeding, injury to surrounding organ structures, internal scar tissue, uterine perforation and its risk,  thermal injury, fluid overload. All ? answered  Lori Costa A Lori Costa 12/26/2017, 6:36 AM

## 2017-12-27 ENCOUNTER — Encounter (HOSPITAL_COMMUNITY): Payer: Self-pay | Admitting: Obstetrics and Gynecology

## 2018-03-19 DIAGNOSIS — Z3042 Encounter for surveillance of injectable contraceptive: Secondary | ICD-10-CM | POA: Diagnosis not present

## 2018-03-19 DIAGNOSIS — Z3202 Encounter for pregnancy test, result negative: Secondary | ICD-10-CM | POA: Diagnosis not present

## 2018-04-14 DIAGNOSIS — J069 Acute upper respiratory infection, unspecified: Secondary | ICD-10-CM | POA: Diagnosis not present

## 2018-06-05 DIAGNOSIS — Z3042 Encounter for surveillance of injectable contraceptive: Secondary | ICD-10-CM | POA: Diagnosis not present

## 2018-06-23 DIAGNOSIS — R509 Fever, unspecified: Secondary | ICD-10-CM | POA: Diagnosis not present

## 2018-06-23 DIAGNOSIS — J101 Influenza due to other identified influenza virus with other respiratory manifestations: Secondary | ICD-10-CM | POA: Diagnosis not present

## 2018-08-05 DIAGNOSIS — Z118 Encounter for screening for other infectious and parasitic diseases: Secondary | ICD-10-CM | POA: Diagnosis not present

## 2018-08-05 DIAGNOSIS — Z6826 Body mass index (BMI) 26.0-26.9, adult: Secondary | ICD-10-CM | POA: Diagnosis not present

## 2018-08-05 DIAGNOSIS — Z01419 Encounter for gynecological examination (general) (routine) without abnormal findings: Secondary | ICD-10-CM | POA: Diagnosis not present

## 2018-08-21 DIAGNOSIS — Z3042 Encounter for surveillance of injectable contraceptive: Secondary | ICD-10-CM | POA: Diagnosis not present

## 2018-11-07 DIAGNOSIS — Z3042 Encounter for surveillance of injectable contraceptive: Secondary | ICD-10-CM | POA: Diagnosis not present

## 2019-01-26 DIAGNOSIS — Z3042 Encounter for surveillance of injectable contraceptive: Secondary | ICD-10-CM | POA: Diagnosis not present

## 2019-01-27 DIAGNOSIS — K080 Exfoliation of teeth due to systemic causes: Secondary | ICD-10-CM | POA: Diagnosis not present

## 2019-02-19 IMAGING — CR DG CHEST 2V
2 series · 2 of 2 positions shown · non-contrast
Comparison: None.

CLINICAL DATA: Chest pain

EXAM:
CHEST  2 VIEW

[w chest pa]
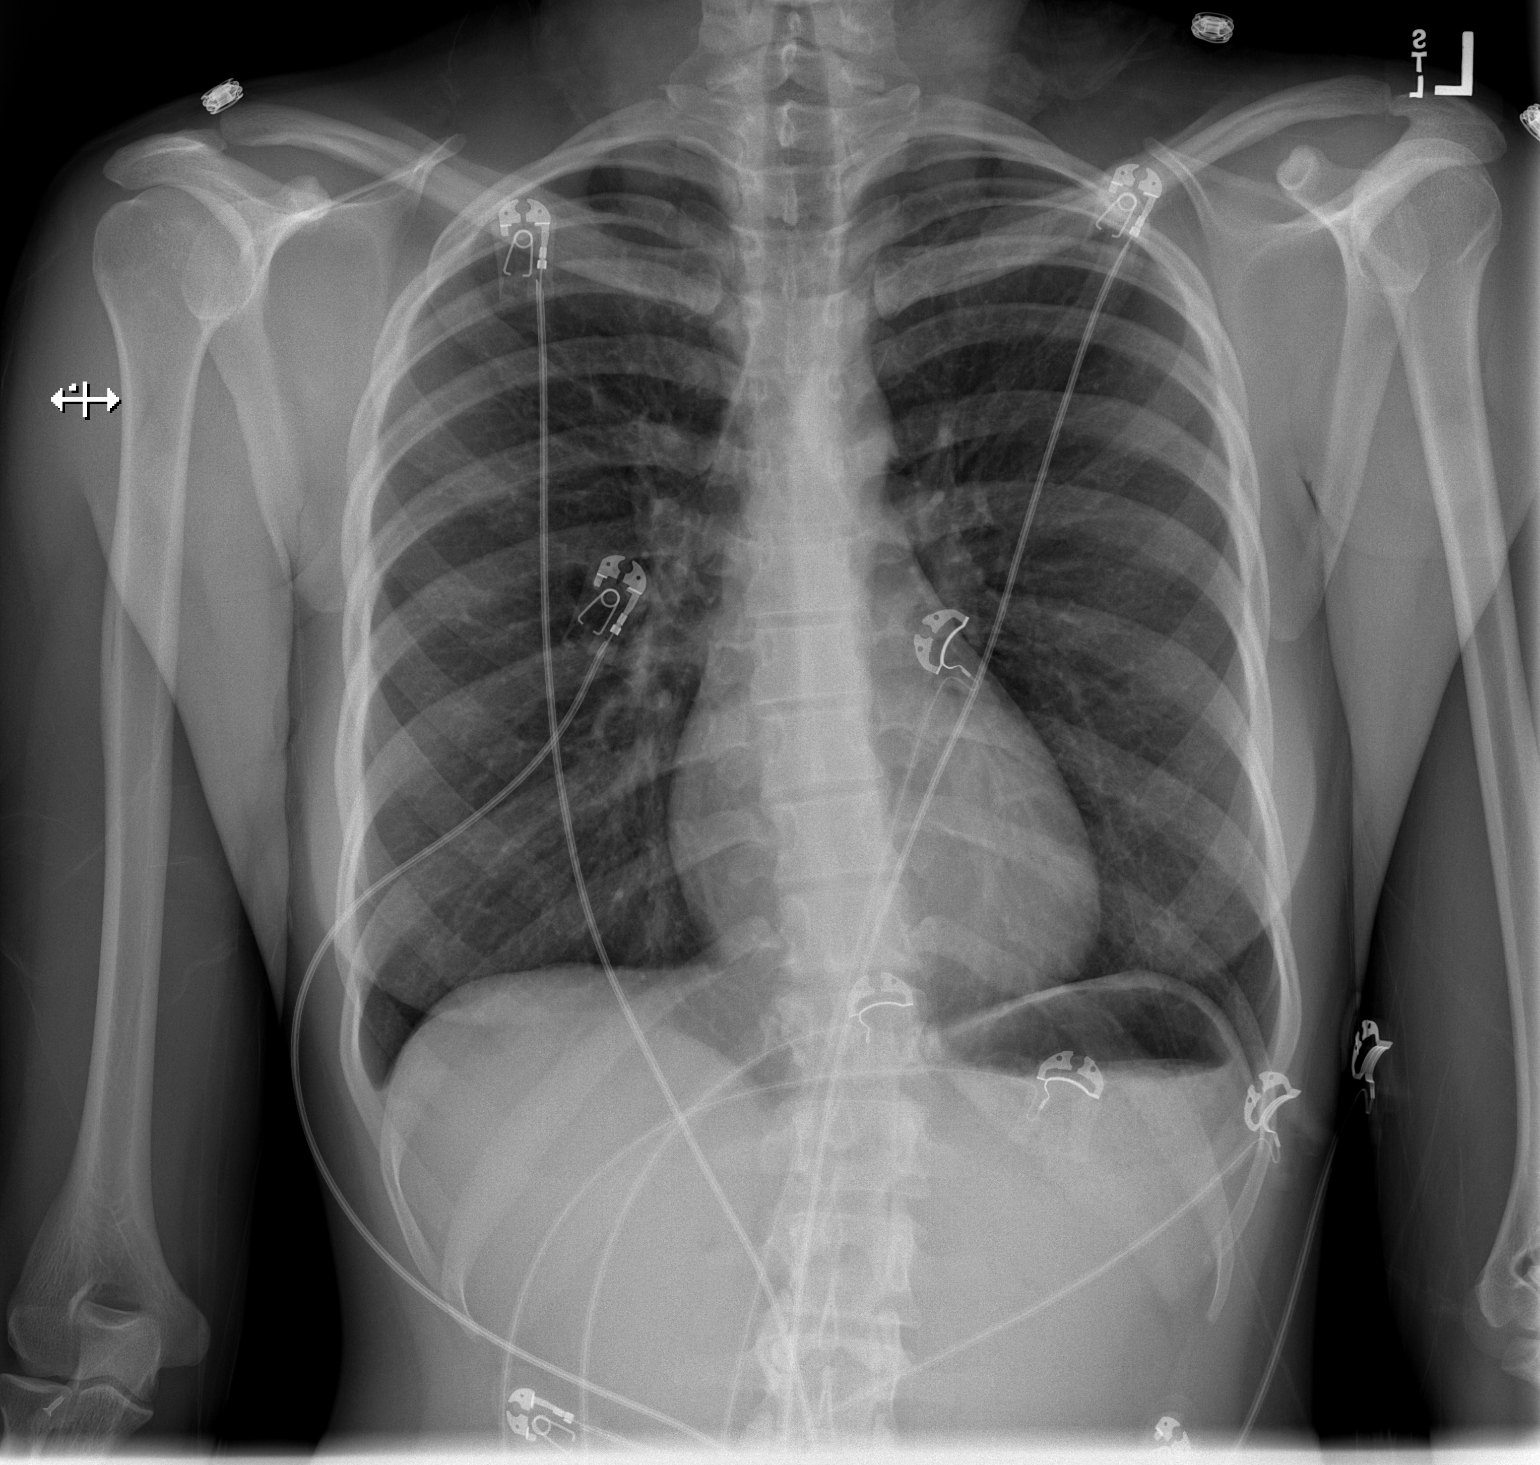

[w chest lat]
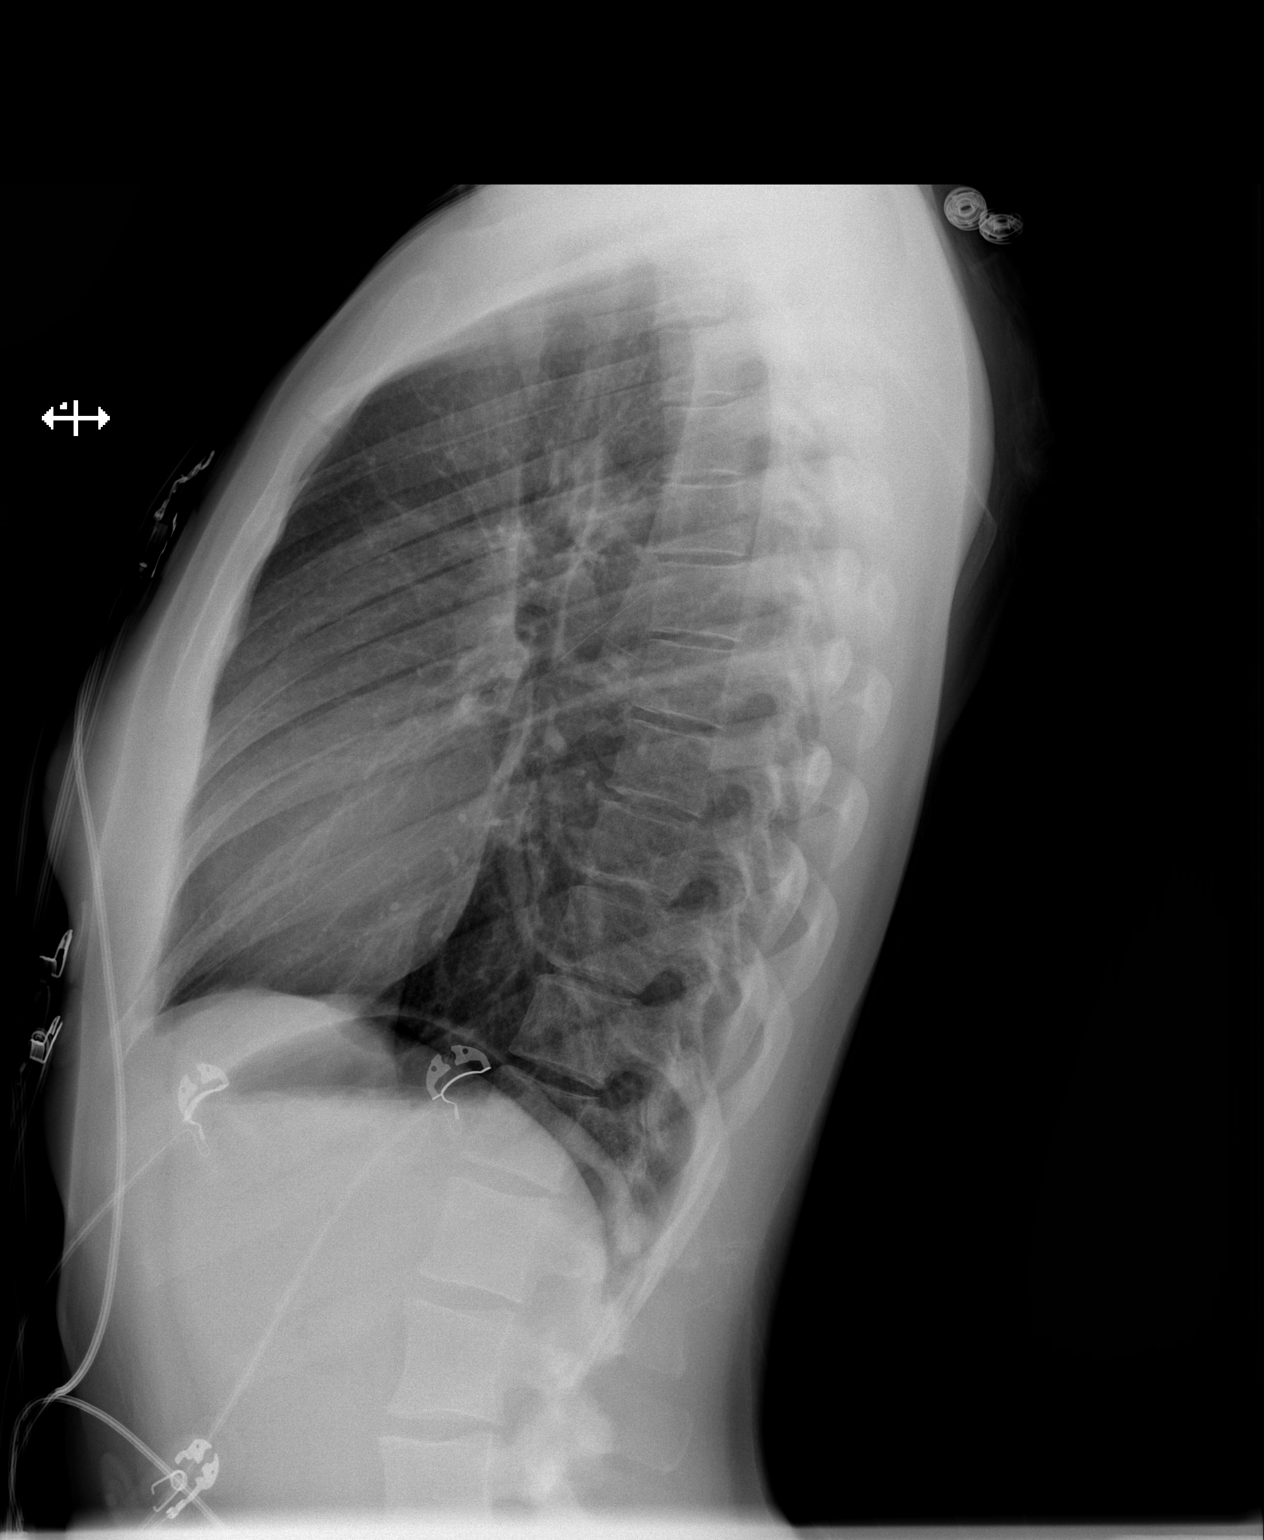

[2 of 2 positions shown; findings below may reference images not displayed]

FINDINGS: Lungs are clear.  No pleural effusion or pneumothorax.

The heart is normal in size.

Visualized osseous structures are within normal limits.
IMPRESSION: Normal chest radiographs.

## 2019-04-21 DIAGNOSIS — Z3042 Encounter for surveillance of injectable contraceptive: Secondary | ICD-10-CM | POA: Diagnosis not present

## 2019-04-29 DIAGNOSIS — L700 Acne vulgaris: Secondary | ICD-10-CM | POA: Diagnosis not present

## 2019-04-29 DIAGNOSIS — L218 Other seborrheic dermatitis: Secondary | ICD-10-CM | POA: Diagnosis not present

## 2019-07-20 DIAGNOSIS — Z3042 Encounter for surveillance of injectable contraceptive: Secondary | ICD-10-CM | POA: Diagnosis not present

## 2019-08-16 DIAGNOSIS — R509 Fever, unspecified: Secondary | ICD-10-CM | POA: Diagnosis not present

## 2019-08-16 DIAGNOSIS — R519 Headache, unspecified: Secondary | ICD-10-CM | POA: Diagnosis not present

## 2019-08-16 DIAGNOSIS — Z20822 Contact with and (suspected) exposure to covid-19: Secondary | ICD-10-CM | POA: Diagnosis not present

## 2019-08-25 DIAGNOSIS — Z01419 Encounter for gynecological examination (general) (routine) without abnormal findings: Secondary | ICD-10-CM | POA: Diagnosis not present

## 2019-08-25 DIAGNOSIS — Z6826 Body mass index (BMI) 26.0-26.9, adult: Secondary | ICD-10-CM | POA: Diagnosis not present

## 2019-08-25 DIAGNOSIS — Z1151 Encounter for screening for human papillomavirus (HPV): Secondary | ICD-10-CM | POA: Diagnosis not present

## 2019-10-07 DIAGNOSIS — Z3042 Encounter for surveillance of injectable contraceptive: Secondary | ICD-10-CM | POA: Diagnosis not present

## 2019-10-13 DIAGNOSIS — R102 Pelvic and perineal pain: Secondary | ICD-10-CM | POA: Diagnosis not present

## 2020-01-05 DIAGNOSIS — Z3042 Encounter for surveillance of injectable contraceptive: Secondary | ICD-10-CM | POA: Diagnosis not present
